# Patient Record
Sex: Female | Born: 1985
Health system: Southern US, Community
[De-identification: ages and names within clinical notes are randomized; demographics above are authoritative.]

## PROBLEM LIST (undated history)

## (undated) DIAGNOSIS — O24419 Gestational diabetes mellitus in pregnancy, unspecified control: Secondary | ICD-10-CM

## (undated) DIAGNOSIS — O139 Gestational [pregnancy-induced] hypertension without significant proteinuria, unspecified trimester: Secondary | ICD-10-CM

## (undated) HISTORY — PX: OTHER SURGICAL HISTORY: SHX169

## (undated) HISTORY — DX: Gestational (pregnancy-induced) hypertension without significant proteinuria, unspecified trimester: O13.9

## (undated) HISTORY — DX: Gestational diabetes mellitus in pregnancy, unspecified control: O24.419

---
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
## 2010-03-27 ENCOUNTER — Ambulatory Visit: Payer: Self-pay | Admitting: Interventional Radiology

## 2010-03-27 ENCOUNTER — Emergency Department (HOSPITAL_BASED_OUTPATIENT_CLINIC_OR_DEPARTMENT_OTHER): Admission: EM | Admit: 2010-03-27 | Discharge: 2010-03-27 | Payer: Self-pay | Admitting: Emergency Medicine

## 2010-10-07 ENCOUNTER — Emergency Department (HOSPITAL_BASED_OUTPATIENT_CLINIC_OR_DEPARTMENT_OTHER)
Admission: EM | Admit: 2010-10-07 | Discharge: 2010-10-07 | Payer: Self-pay | Source: Home / Self Care | Admitting: Emergency Medicine

## 2011-02-18 NOTE — Op Note (Signed)
[place]  Patient:    Sara Keith, Sara Keith Visit Number: [id number] MRN: [id number]          Service Type: DSU Location: [facility] Attending Physician:  Gustavus Messing Dictated by:   Yaakov Guthrie. Shon Hough, M.D. Admit Date:  03/11/2002                             Operative Report  PROCEDURES:  Wide excision of hidradenitis suppurativa of the right axilla with Rhyan-Pollack closure.  ANESTHESIA:  General.  INDICATIONS:  A 25 year old lady with severe history of hidradenitis involving the right axilla.  The patient has had an increase in pain, discomfort, drainage, and soreness of the areas chronically.  He has not responded to conservative treatment.  DESCRIPTION OF PROCEDURE:  The patient was taken to the operating room and placed on the operating room table in the supine position.  Drawings were made to outline all of the hair bearing areas involving the right axilla.  She underwent general anesthesia and was intubated orally.  Prep was done to the right axilla, arm, and chest areas using Betadine soap and solution and walled off with sterile towels and drapes so as to make a sterile field. Xylocaine 0.5% with epinephrine 1:200,000 concentration was injected locally, a total of 100 cc.  The area was scored with a #15 blade and the skin was excised down to underlying superficial fascia, removing all of the disease en bloc.  After proper hemostasis, the medial and lateral edges were then freed up significantly approximately 5 cm.  Closure was done with 0 Monocryl x 2 levels in the subcu plane and the fascia, a subdermal suture of 3-0 Monocryl, and then a running subcuticular stitch of 3-0 Monocryl.  The wounds were drained with a #10 Blake drain which was placed in the depths of the wound and brought out through the lateral most portion of the incision inferiorly and secured with 3-0 Prolene.  The wounds were cleans. Steri-Strips and a soft  dressing were applied to all areas.  She withstood the procedures very well and taken to recovery in excellent condition. Dictated by:   Yaakov Guthrie. Shon Hough, M.D. Attending Physician:  Gustavus Messing DD:  03/11/02 TD:  03/12/02 Job: [id number]

## 2012-10-14 ENCOUNTER — Emergency Department (HOSPITAL_BASED_OUTPATIENT_CLINIC_OR_DEPARTMENT_OTHER)
Admission: EM | Admit: 2012-10-14 | Discharge: 2012-10-14 | Disposition: A | Discharge status: Home / Self Care | Payer: Medicaid Other | Attending: Emergency Medicine | Admitting: Emergency Medicine

## 2012-10-14 ENCOUNTER — Encounter (HOSPITAL_BASED_OUTPATIENT_CLINIC_OR_DEPARTMENT_OTHER): Payer: Self-pay | Admitting: *Deleted

## 2012-10-14 DIAGNOSIS — Z3202 Encounter for pregnancy test, result negative: Secondary | ICD-10-CM | POA: Insufficient documentation

## 2012-10-14 DIAGNOSIS — L02412 Cutaneous abscess of left axilla: Secondary | ICD-10-CM

## 2012-10-14 DIAGNOSIS — IMO0002 Reserved for concepts with insufficient information to code with codable children: Secondary | ICD-10-CM | POA: Insufficient documentation

## 2012-10-14 LAB — PREGNANCY, URINE: Preg Test, Ur: NEGATIVE

## 2012-10-14 MED ORDER — HYDROCODONE-ACETAMINOPHEN 5-325 MG PO TABS: 2.0000 | ORAL_TABLET | ORAL | Status: DC | PRN

## 2012-10-14 MED ORDER — SULFAMETHOXAZOLE-TRIMETHOPRIM 800-160 MG PO TABS: 1.0000 | ORAL_TABLET | Freq: Two times a day (BID) | ORAL | Status: DC

## 2012-10-14 MED ORDER — LIDOCAINE HCL 2 % IJ SOLN
10.0000 mL | Freq: Once | INTRAMUSCULAR | Status: AC
  Administered 2012-10-14: 200 mg
  Filled 2012-10-14: qty 20

## 2012-10-14 NOTE — ED Provider Notes (Signed)
Medical screening examination/treatment/procedure(s) were performed by non-physician practitioner and as supervising physician I was immediately available for consultation/collaboration.   Gavin Pound. Rudransh Bellanca, MD 10/14/12 1850

## 2012-10-14 NOTE — ED Provider Notes (Signed)
History     CSN: [id number]  Arrival date & time 10/14/12  1615   First MD Initiated Contact with Patient 10/14/12 1740      Chief Complaint  Patient presents with  . Recurrent Skin Infections    (Consider location/radiation/quality/duration/timing/severity/associated sxs/prior treatment) Patient is a 27 y.o. female presenting with abscess. The history is provided by the patient. No language interpreter was used.  Abscess  This is a new problem. The problem occurs continuously. The problem has been gradually worsening. The abscess is present on the left arm. The problem is severe. The abscess is characterized by swelling, painfulness and itchiness. There were no sick contacts.  Pt has had multiple skin abscesses and surgery to decrease(hidradenitis suppurantia) Pt complains of a large abscess under right arm History reviewed. No pertinent past medical history.  Past Surgical History  Procedure Date  . Sweat glan     History reviewed. No pertinent family history.  History  Substance Use Topics  . Smoking status: Never Smoker   . Smokeless tobacco: Not on file  . Alcohol Use: No    OB History    Grav Para Term Preterm Abortions TAB SAB Ect Mult Living                  Review of Systems  Skin: Positive for wound.  All other systems reviewed and are negative.    Allergies  Review of patient's allergies indicates no known allergies.  Home Medications   Current Outpatient Rx  Name  Route  Sig  Dispense  Refill  . HYDROCODONE-ACETAMINOPHEN 5-325 MG PO TABS   Oral   Take 2 tablets by mouth every 4 (four) hours as needed for pain.   10 tablet   0   . SULFAMETHOXAZOLE-TRIMETHOPRIM 800-160 MG PO TABS   Oral   Take 1 tablet by mouth every 12 (twelve) hours.   10 tablet   0     BP 151/84  Pulse 82  Temp 98.3 F (36.8 C) (Oral)  Resp 18  SpO2 100%  LMP 08/14/2012  Physical Exam  Nursing note and vitals reviewed. Constitutional: She is oriented to  person, place, and time. She appears well-developed and well-nourished.  Musculoskeletal: She exhibits tenderness.       Large abscess under left axilla  Neurological: She is alert and oriented to person, place, and time. She has normal reflexes.  Skin: There is erythema.  Psychiatric: She has a normal mood and affect.    ED Course  INCISION AND DRAINAGE Date/Time: 10/14/2012 6:48 PM Performed by: Elson Areas Authorized by: Elson Areas Consent: Verbal consent not obtained. Risks and benefits: risks, benefits and alternatives were discussed Consent given by: patient Patient understanding: patient does not state understanding of the procedure being performed Required items: required blood products, implants, devices, and special equipment available Patient identity confirmed: verbally with patient Type: abscess Body area: upper extremity Anesthesia: local infiltration Patient sedated: no Scalpel size: 11 Incision type: single straight Complexity: simple Drainage: purulent Wound treatment: wound left open Packing material: 1/4 in iodoform gauze Patient tolerance: Patient tolerated the procedure well with no immediate complications.   (including critical care time)   Labs Reviewed  PREGNANCY, URINE   No results found.   1. Abscess of left axilla       MDM          Elson Areas, [place] 10/14/12 1849

## 2012-10-14 NOTE — ED Notes (Signed)
Patient has a large abcess under left arm. States that she has had sweat glands removed due to the same.

## 2013-02-05 ENCOUNTER — Encounter (HOSPITAL_BASED_OUTPATIENT_CLINIC_OR_DEPARTMENT_OTHER): Payer: Self-pay | Admitting: *Deleted

## 2013-02-05 ENCOUNTER — Emergency Department (HOSPITAL_BASED_OUTPATIENT_CLINIC_OR_DEPARTMENT_OTHER)
Admission: EM | Admit: 2013-02-05 | Discharge: 2013-02-05 | Disposition: A | Discharge status: Home / Self Care | Payer: Medicaid Other | Attending: Emergency Medicine | Admitting: Emergency Medicine

## 2013-02-05 DIAGNOSIS — R1033 Periumbilical pain: Secondary | ICD-10-CM | POA: Insufficient documentation

## 2013-02-05 DIAGNOSIS — K5289 Other specified noninfective gastroenteritis and colitis: Secondary | ICD-10-CM | POA: Insufficient documentation

## 2013-02-05 DIAGNOSIS — R112 Nausea with vomiting, unspecified: Secondary | ICD-10-CM | POA: Insufficient documentation

## 2013-02-05 DIAGNOSIS — Z3202 Encounter for pregnancy test, result negative: Secondary | ICD-10-CM | POA: Insufficient documentation

## 2013-02-05 DIAGNOSIS — R109 Unspecified abdominal pain: Secondary | ICD-10-CM

## 2013-02-05 DIAGNOSIS — R197 Diarrhea, unspecified: Secondary | ICD-10-CM | POA: Insufficient documentation

## 2013-02-05 DIAGNOSIS — R6883 Chills (without fever): Secondary | ICD-10-CM | POA: Insufficient documentation

## 2013-02-05 DIAGNOSIS — K529 Noninfective gastroenteritis and colitis, unspecified: Secondary | ICD-10-CM

## 2013-02-05 LAB — CBC WITH DIFFERENTIAL/PLATELET
Eosinophils Relative: 1 % (ref 0–5)
HCT: 39.2 % (ref 36.0–46.0)
Hemoglobin: 13 g/dL (ref 12.0–15.0)
MCHC: 33.2 g/dL (ref 30.0–36.0)
MCV: 70.4 fL — ABNORMAL LOW (ref 78.0–100.0)
Neutro Abs: 7 10*3/uL (ref 1.7–7.7)
Platelets: ADEQUATE 10*3/uL (ref 150–400)
RBC: 5.57 MIL/uL — ABNORMAL HIGH (ref 3.87–5.11)
RDW: 14.2 % (ref 11.5–15.5)
WBC: 9.7 10*3/uL (ref 4.0–10.5)

## 2013-02-05 LAB — URINALYSIS, ROUTINE W REFLEX MICROSCOPIC
Bilirubin Urine: NEGATIVE
Glucose, UA: NEGATIVE mg/dL
Leukocytes, UA: NEGATIVE
Protein, ur: NEGATIVE mg/dL
Specific Gravity, Urine: 1.022 (ref 1.005–1.030)
pH: 8.5 — ABNORMAL HIGH (ref 5.0–8.0)

## 2013-02-05 LAB — COMPREHENSIVE METABOLIC PANEL
ALT: 16 U/L (ref 0–35)
Alkaline Phosphatase: 58 U/L (ref 39–117)
Calcium: 9.7 mg/dL (ref 8.4–10.5)
Chloride: 105 mEq/L (ref 96–112)
Creatinine, Ser: 0.8 mg/dL (ref 0.50–1.10)
GFR calc Af Amer: >90 mL/min (ref >90)
Sodium: 139 mEq/L (ref 135–145)
Total Bilirubin: 0.4 mg/dL (ref 0.3–1.2)

## 2013-02-05 LAB — PREGNANCY, URINE: Preg Test, Ur: NEGATIVE

## 2013-02-05 MED ORDER — ONDANSETRON HCL 4 MG/2ML IJ SOLN
4.0000 mg | Freq: Once | INTRAMUSCULAR | Status: AC
  Administered 2013-02-05: 4 mg via INTRAVENOUS
  Filled 2013-02-05: qty 2

## 2013-02-05 MED ORDER — METOCLOPRAMIDE HCL 10 MG PO TABS: 10.0000 mg | ORAL_TABLET | Freq: Four times a day (QID) | ORAL | Status: DC | PRN

## 2013-02-05 MED ORDER — SODIUM CHLORIDE 0.9 % IV SOLN
1000.0000 mL | Freq: Once | INTRAVENOUS | Status: AC
  Administered 2013-02-05: 1000 mL via INTRAVENOUS

## 2013-02-05 MED ORDER — MORPHINE SULFATE 4 MG/ML IJ SOLN
4.0000 mg | Freq: Once | INTRAMUSCULAR | Status: AC
  Administered 2013-02-05: 4 mg via INTRAVENOUS
  Filled 2013-02-05: qty 1

## 2013-02-05 MED ORDER — OXYCODONE-ACETAMINOPHEN 5-325 MG PO TABS: 1.0000 | ORAL_TABLET | ORAL | Status: DC | PRN

## 2013-02-05 MED ORDER — SODIUM CHLORIDE 0.9 % IV SOLN: 1000.0000 mL | INTRAVENOUS | Status: DC

## 2013-02-05 NOTE — ED Notes (Signed)
Abdominal pain with vomiting and diarrhea onset last night at 9 pm pt states also had chills last night  Ambulatory to exam room Pt denies pain with urination also denies any vaginal discharge

## 2013-02-05 NOTE — ED Provider Notes (Signed)
History     CSN: [id number]  Arrival date & time 02/05/13  7829   First MD Initiated Contact with Patient 02/05/13 1036      Chief Complaint  Patient presents with  . Abdominal Pain    (Consider location/radiation/quality/duration/timing/severity/associated sxs/prior treatment) Patient is a 27 y.o. female presenting with abdominal pain. The history is provided by the patient.  Abdominal Pain She had onset last night of crampy periumbilical pain with associated nausea and vomiting. She had one episode of diarrhea. Pain is severe and she rates it at 10/10. Pain improves modestly after vomiting. She had one episode of diarrhea. She has had chills and sweats but denies any fever. She denies any sick contacts. She has not taken anything to try and help her symptoms.  History reviewed. No pertinent past medical history.  Past Surgical History  Procedure Laterality Date  . Sweat glan      History reviewed. No pertinent family history.  History  Substance Use Topics  . Smoking status: Never Smoker   . Smokeless tobacco: Not on file  . Alcohol Use: No    OB History   Grav Para Term Preterm Abortions TAB SAB Ect Mult Living                  Review of Systems  Gastrointestinal: Positive for abdominal pain.  All other systems reviewed and are negative.    Allergies  Review of patient's allergies indicates no known allergies.  Home Medications   Current Outpatient Rx  Name  Route  Sig  Dispense  Refill  . HYDROcodone-acetaminophen (NORCO/VICODIN) 5-325 MG per tablet   Oral   Take 2 tablets by mouth every 4 (four) hours as needed for pain.   10 tablet   0   . sulfamethoxazole-trimethoprim (SEPTRA DS) 800-160 MG per tablet   Oral   Take 1 tablet by mouth every 12 (twelve) hours.   10 tablet   0     BP 151/66  Pulse 84  Temp(Src) 98.5 F (36.9 C) (Oral)  Resp 22  Ht 5\' 7"  (1.702 m)  Wt 265 lb (120.203 kg)  BMI 41.5 kg/m2  SpO2 99%  LMP  01/14/2013  Physical Exam  Nursing note and vitals reviewed.  27 year old female, resting comfortably and in no acute distress. Vital signs are significant for hypertension with blood pressure 151/66, and tachypnea with respiratory rate of 22. Oxygen saturation is 99%, which is normal. Head is normocephalic and atraumatic. PERRLA, EOMI. Oropharynx is clear. Neck is nontender and supple without adenopathy or JVD. Back is nontender and there is no CVA tenderness. Lungs are clear without rales, wheezes, or rhonchi. Chest is nontender. Heart has regular rate and rhythm without murmur. Abdomen is soft, flat, with mild tenderness diffusely. There is no rebound or guarding. There are no masses or hepatosplenomegaly and peristalsis is hyp oactive. Extremities have no cyanosis or edema, full range of motion is present. Skin is warm and dry without rash. Neurologic: Mental status is normal, cranial nerves are intact, there are no motor or sensory deficits.  ED Course  Procedures (including critical care time)  Results for orders placed during the hospital encounter of 02/05/13  PREGNANCY, URINE      Result Value Range   Preg Test, Ur NEGATIVE  NEGATIVE  URINALYSIS, ROUTINE W REFLEX MICROSCOPIC      Result Value Range   Color, Urine YELLOW  YELLOW   APPearance CLEAR  CLEAR   Specific Gravity, Urine  1.022  1.005 - 1.030   pH 8.5 (*) 5.0 - 8.0   Glucose, UA NEGATIVE  NEGATIVE mg/dL   Hgb urine dipstick NEGATIVE  NEGATIVE   Bilirubin Urine NEGATIVE  NEGATIVE   Ketones, ur NEGATIVE  NEGATIVE mg/dL   Protein, ur NEGATIVE  NEGATIVE mg/dL   Urobilinogen, UA 0.2  0.0 - 1.0 mg/dL   Nitrite NEGATIVE  NEGATIVE   Leukocytes, UA NEGATIVE  NEGATIVE  CBC WITH DIFFERENTIAL      Result Value Range   WBC 9.7  4.0 - 10.5 K/uL   RBC 5.57 (*) 3.87 - 5.11 MIL/uL   Hemoglobin 13.0  12.0 - 15.0 g/dL   HCT 16.1  09.6 - 04.5 %   MCV 70.4 (*) 78.0 - 100.0 fL   MCH 23.3 (*) 26.0 - 34.0 pg   MCHC 33.2  30.0 -  36.0 g/dL   RDW 40.9  81.1 - 91.4 %   Platelets    150 - 400 K/uL   Value: PLATELET CLUMPS NOTED ON SMEAR, COUNT APPEARS ADEQUATE   Neutrophils Relative 72  43 - 77 %   Lymphocytes Relative 20  12 - 46 %   Monocytes Relative 7  3 - 12 %   Eosinophils Relative 1  0 - 5 %   Basophils Relative 0  0 - 1 %   Neutro Abs 7.0  1.7 - 7.7 K/uL   Lymphs Abs 1.9  0.7 - 4.0 K/uL   Monocytes Absolute 0.7  0.1 - 1.0 K/uL   Eosinophils Absolute 0.1  0.0 - 0.7 K/uL   Basophils Absolute 0.0  0.0 - 0.1 K/uL   Smear Review LARGE PLATELETS PRESENT    COMPREHENSIVE METABOLIC PANEL      Result Value Range   Sodium 139  135 - 145 mEq/L   Potassium 4.1  3.5 - 5.1 mEq/L   Chloride 105  96 - 112 mEq/L   CO2 24  19 - 32 mEq/L   Glucose, Bld 114 (*) 70 - 99 mg/dL   BUN 6  6 - 23 mg/dL   Creatinine, Ser 7.82  0.50 - 1.10 mg/dL   Calcium 9.7  8.4 - 95.6 mg/dL   Total Protein 7.6  6.0 - 8.3 g/dL   Albumin 3.9  3.5 - 5.2 g/dL   AST 16  0 - 37 U/L   ALT 16  0 - 35 U/L   Alkaline Phosphatase 58  39 - 117 U/L   Total Bilirubin 0.4  0.3 - 1.2 mg/dL   GFR calc non Af Amer >90  >90 mL/min   GFR calc Af Amer >90  >90 mL/min  LIPASE, BLOOD      Result Value Range   Lipase 21  11 - 59 U/L     1. Gastroenteritis   2. Abdominal pain       MDM  Crampy abdominal pain, vomiting, diarrhea most consistent with viral gastroenteritis. Screening labs have been ordered and she'll be given IV fluids, IV ondansetron, and IV morphine.  Nausea is improved with above noted treatment but she continues to complain of abdominal pain. Laboratory workup is unremarkable including normal WBC normal differential. She'll be given additional dose of morphine. If she is not improving, may need to consider CT scan.  She got good relief with second dose of morphine. She is feeling much better and has taken oral fluids and crackers. She is discharged with prescriptions for metoclopramide for nausea and advised to use over-the-counter  loperamide  as needed for diarrhea. She's also given a prescription for a small number of Percocet if needed for abdominal pain. She is to return if symptoms worsen.  Dione Booze, MD 02/05/13 [phone]

## 2013-02-05 NOTE — ED Notes (Signed)
Pt states she is unable to void at present 

## 2013-09-24 LAB — OB RESULTS CONSOLE ANTIBODY SCREEN: Antibody Screen: NEGATIVE

## 2013-09-24 LAB — OB RESULTS CONSOLE HIV ANTIBODY (ROUTINE TESTING): HIV: NONREACTIVE

## 2013-09-24 LAB — OB RESULTS CONSOLE ABO/RH: RH TYPE: POSITIVE

## 2013-09-24 LAB — OB RESULTS CONSOLE GC/CHLAMYDIA
Chlamydia: NEGATIVE
Gonorrhea: NEGATIVE

## 2013-09-24 LAB — OB RESULTS CONSOLE RPR: RPR: NONREACTIVE

## 2013-09-24 LAB — OB RESULTS CONSOLE HEPATITIS B SURFACE ANTIGEN: HEP B S AG: NEGATIVE

## 2013-09-24 LAB — OB RESULTS CONSOLE RUBELLA ANTIBODY, IGM: RUBELLA: UNDETERMINED

## 2013-10-22 ENCOUNTER — Other Ambulatory Visit: Payer: Self-pay

## 2013-11-06 ENCOUNTER — Other Ambulatory Visit (HOSPITAL_COMMUNITY): Payer: Self-pay | Admitting: Obstetrics and Gynecology

## 2013-11-06 ENCOUNTER — Encounter (HOSPITAL_COMMUNITY): Payer: Self-pay | Admitting: Obstetrics and Gynecology

## 2013-11-06 DIAGNOSIS — O280 Abnormal hematological finding on antenatal screening of mother: Secondary | ICD-10-CM

## 2013-11-06 DIAGNOSIS — Z3689 Encounter for other specified antenatal screening: Secondary | ICD-10-CM

## 2013-11-15 ENCOUNTER — Ambulatory Visit (HOSPITAL_COMMUNITY)
Admission: RE | Admit: 2013-11-15 | Discharge: 2013-11-15 | Disposition: A | Discharge status: Home / Self Care | Payer: Medicaid Other | Source: Ambulatory Visit | Attending: Obstetrics and Gynecology | Admitting: Obstetrics and Gynecology

## 2013-11-15 ENCOUNTER — Encounter (HOSPITAL_COMMUNITY): Payer: Self-pay

## 2013-11-15 ENCOUNTER — Ambulatory Visit (HOSPITAL_COMMUNITY): Admission: RE | Admit: 2013-11-15 | Payer: Medicaid Other | Source: Ambulatory Visit

## 2013-11-15 ENCOUNTER — Other Ambulatory Visit (HOSPITAL_COMMUNITY): Payer: Self-pay | Admitting: Obstetrics and Gynecology

## 2013-11-15 DIAGNOSIS — Z3689 Encounter for other specified antenatal screening: Secondary | ICD-10-CM

## 2013-11-15 DIAGNOSIS — O280 Abnormal hematological finding on antenatal screening of mother: Secondary | ICD-10-CM

## 2013-11-15 DIAGNOSIS — Z1389 Encounter for screening for other disorder: Secondary | ICD-10-CM | POA: Insufficient documentation

## 2013-11-15 DIAGNOSIS — O289 Unspecified abnormal findings on antenatal screening of mother: Secondary | ICD-10-CM | POA: Insufficient documentation

## 2013-11-15 DIAGNOSIS — O358XX0 Maternal care for other (suspected) fetal abnormality and damage, not applicable or unspecified: Secondary | ICD-10-CM | POA: Insufficient documentation

## 2013-11-15 DIAGNOSIS — Z363 Encounter for antenatal screening for malformations: Secondary | ICD-10-CM | POA: Insufficient documentation

## 2013-11-21 ENCOUNTER — Telehealth (HOSPITAL_COMMUNITY): Payer: Self-pay | Admitting: *Deleted

## 2013-11-21 NOTE — Telephone Encounter (Signed)
Pt notified of fetal echo appt tomorrow at 2pm at Dr. Beryl MeagerMauer's office.  Pt verbalized understanding. Also reviewed that Quad screen came back normal after recalculation.

## 2013-12-10 ENCOUNTER — Other Ambulatory Visit (HOSPITAL_COMMUNITY): Payer: Self-pay | Admitting: Obstetrics and Gynecology

## 2013-12-10 DIAGNOSIS — O35BXX0 Maternal care for other (suspected) fetal abnormality and damage, fetal cardiac anomalies, not applicable or unspecified: Secondary | ICD-10-CM

## 2013-12-10 DIAGNOSIS — O358XX0 Maternal care for other (suspected) fetal abnormality and damage, not applicable or unspecified: Secondary | ICD-10-CM

## 2013-12-13 ENCOUNTER — Ambulatory Visit (HOSPITAL_COMMUNITY)
Admission: RE | Admit: 2013-12-13 | Discharge: 2013-12-13 | Disposition: A | Discharge status: Home / Self Care | Payer: Medicaid Other | Source: Ambulatory Visit | Attending: Obstetrics and Gynecology | Admitting: Obstetrics and Gynecology

## 2013-12-13 DIAGNOSIS — O34219 Maternal care for unspecified type scar from previous cesarean delivery: Secondary | ICD-10-CM | POA: Insufficient documentation

## 2013-12-13 DIAGNOSIS — O289 Unspecified abnormal findings on antenatal screening of mother: Secondary | ICD-10-CM | POA: Insufficient documentation

## 2013-12-13 DIAGNOSIS — O99210 Obesity complicating pregnancy, unspecified trimester: Secondary | ICD-10-CM

## 2013-12-13 DIAGNOSIS — E669 Obesity, unspecified: Secondary | ICD-10-CM | POA: Insufficient documentation

## 2013-12-13 DIAGNOSIS — O35BXX0 Maternal care for other (suspected) fetal abnormality and damage, fetal cardiac anomalies, not applicable or unspecified: Secondary | ICD-10-CM

## 2013-12-13 DIAGNOSIS — O358XX0 Maternal care for other (suspected) fetal abnormality and damage, not applicable or unspecified: Secondary | ICD-10-CM

## 2013-12-13 DIAGNOSIS — IMO0001 Reserved for inherently not codable concepts without codable children: Secondary | ICD-10-CM | POA: Insufficient documentation

## 2014-01-07 ENCOUNTER — Other Ambulatory Visit (HOSPITAL_COMMUNITY): Payer: Self-pay | Admitting: Obstetrics and Gynecology

## 2014-01-07 DIAGNOSIS — O289 Unspecified abnormal findings on antenatal screening of mother: Secondary | ICD-10-CM

## 2014-01-07 DIAGNOSIS — IMO0001 Reserved for inherently not codable concepts without codable children: Secondary | ICD-10-CM

## 2014-01-07 DIAGNOSIS — O99210 Obesity complicating pregnancy, unspecified trimester: Secondary | ICD-10-CM

## 2014-01-07 DIAGNOSIS — O34219 Maternal care for unspecified type scar from previous cesarean delivery: Secondary | ICD-10-CM

## 2014-01-07 DIAGNOSIS — E669 Obesity, unspecified: Secondary | ICD-10-CM

## 2014-01-10 ENCOUNTER — Ambulatory Visit (HOSPITAL_COMMUNITY)
Admission: RE | Admit: 2014-01-10 | Discharge: 2014-01-10 | Disposition: A | Discharge status: Home / Self Care | Payer: Medicaid Other | Source: Ambulatory Visit | Attending: Obstetrics and Gynecology | Admitting: Obstetrics and Gynecology

## 2014-01-10 ENCOUNTER — Encounter (HOSPITAL_COMMUNITY): Payer: Self-pay

## 2014-01-10 ENCOUNTER — Other Ambulatory Visit (HOSPITAL_COMMUNITY): Payer: Self-pay | Admitting: Obstetrics and Gynecology

## 2014-01-10 DIAGNOSIS — E669 Obesity, unspecified: Secondary | ICD-10-CM | POA: Insufficient documentation

## 2014-01-10 DIAGNOSIS — O289 Unspecified abnormal findings on antenatal screening of mother: Secondary | ICD-10-CM

## 2014-01-10 DIAGNOSIS — O34219 Maternal care for unspecified type scar from previous cesarean delivery: Secondary | ICD-10-CM

## 2014-01-10 DIAGNOSIS — O99210 Obesity complicating pregnancy, unspecified trimester: Secondary | ICD-10-CM

## 2014-01-10 DIAGNOSIS — IMO0001 Reserved for inherently not codable concepts without codable children: Secondary | ICD-10-CM

## 2014-01-31 ENCOUNTER — Other Ambulatory Visit (HOSPITAL_COMMUNITY): Payer: Self-pay | Admitting: Obstetrics and Gynecology

## 2014-01-31 ENCOUNTER — Inpatient Hospital Stay (HOSPITAL_COMMUNITY)
Admission: AD | Admit: 2014-01-31 | Discharge: 2014-01-31 | Disposition: A | Discharge status: Home / Self Care | Payer: Medicaid Other | Source: Ambulatory Visit | Attending: Obstetrics and Gynecology | Admitting: Obstetrics and Gynecology

## 2014-01-31 ENCOUNTER — Ambulatory Visit (HOSPITAL_COMMUNITY)
Admission: RE | Admit: 2014-01-31 | Discharge: 2014-01-31 | Disposition: A | Discharge status: Home / Self Care | Payer: Medicaid Other | Source: Ambulatory Visit | Attending: Obstetrics and Gynecology | Admitting: Obstetrics and Gynecology

## 2014-01-31 ENCOUNTER — Encounter (HOSPITAL_COMMUNITY): Payer: Self-pay | Admitting: General Practice

## 2014-01-31 DIAGNOSIS — O289 Unspecified abnormal findings on antenatal screening of mother: Secondary | ICD-10-CM

## 2014-01-31 DIAGNOSIS — E669 Obesity, unspecified: Secondary | ICD-10-CM

## 2014-01-31 DIAGNOSIS — O352XX0 Maternal care for (suspected) hereditary disease in fetus, not applicable or unspecified: Secondary | ICD-10-CM | POA: Insufficient documentation

## 2014-01-31 DIAGNOSIS — IMO0001 Reserved for inherently not codable concepts without codable children: Secondary | ICD-10-CM

## 2014-01-31 DIAGNOSIS — O34219 Maternal care for unspecified type scar from previous cesarean delivery: Secondary | ICD-10-CM

## 2014-01-31 DIAGNOSIS — R030 Elevated blood-pressure reading, without diagnosis of hypertension: Secondary | ICD-10-CM | POA: Insufficient documentation

## 2014-01-31 DIAGNOSIS — O99210 Obesity complicating pregnancy, unspecified trimester: Secondary | ICD-10-CM

## 2014-01-31 DIAGNOSIS — O99891 Other specified diseases and conditions complicating pregnancy: Secondary | ICD-10-CM | POA: Insufficient documentation

## 2014-01-31 DIAGNOSIS — O0993 Supervision of high risk pregnancy, unspecified, third trimester: Secondary | ICD-10-CM

## 2014-01-31 DIAGNOSIS — O9989 Other specified diseases and conditions complicating pregnancy, childbirth and the puerperium: Principal | ICD-10-CM

## 2014-01-31 DIAGNOSIS — Q270 Congenital absence and hypoplasia of umbilical artery: Secondary | ICD-10-CM | POA: Insufficient documentation

## 2014-01-31 LAB — COMPREHENSIVE METABOLIC PANEL
ALT: 11 U/L (ref 0–35)
AST: 10 U/L (ref 0–37)
Albumin: 2.7 g/dL — ABNORMAL LOW (ref 3.5–5.2)
Alkaline Phosphatase: 67 U/L (ref 39–117)
BUN: 9 mg/dL (ref 6–23)
CO2: 20 mEq/L (ref 19–32)
Calcium: 9.5 mg/dL (ref 8.4–10.5)
Chloride: 100 mEq/L (ref 96–112)
Creatinine, Ser: 0.65 mg/dL (ref 0.50–1.10)
GFR calc Af Amer: >90 mL/min (ref >90)
GFR calc non Af Amer: >90 mL/min (ref >90)
Glucose, Bld: 118 mg/dL — ABNORMAL HIGH (ref 70–99)
POTASSIUM: 4.1 meq/L (ref 3.7–5.3)
Sodium: 134 mEq/L — ABNORMAL LOW (ref 137–147)
TOTAL PROTEIN: 6.8 g/dL (ref 6.0–8.3)
Total Bilirubin: <0.2 mg/dL — ABNORMAL LOW (ref 0.3–1.2)

## 2014-01-31 LAB — CBC
HCT: 34.6 % — ABNORMAL LOW (ref 36.0–46.0)
Hemoglobin: 11.2 g/dL — ABNORMAL LOW (ref 12.0–15.0)
MCH: 24 pg — ABNORMAL LOW (ref 26.0–34.0)
MCHC: 32.4 g/dL (ref 30.0–36.0)
MCV: 74.2 fL — AB (ref 78.0–100.0)
Platelets: 225 10*3/uL (ref 150–400)
RBC: 4.66 MIL/uL (ref 3.87–5.11)
RDW: 14.1 % (ref 11.5–15.5)
WBC: 15.2 10*3/uL — AB (ref 4.0–10.5)

## 2014-01-31 LAB — PROTEIN / CREATININE RATIO, URINE
Creatinine, Urine: 185 mg/dL
PROTEIN CREATININE RATIO: 0.07 (ref 0.00–0.15)
Total Protein, Urine: 12.3 mg/dL

## 2014-01-31 NOTE — MAU Provider Note (Signed)
Chief Complaint:  Hypertension  @MAUPATCONTACT @  HPI: Sara Keith is a 28 y.o. G2P1001 at [redacted]w[redacted]d who presents to maternity admissions directly from MFM for evaluation of potential PIH and labs to eval for pre-eclampsia.  Patient reports that she is here for further lab testing. Denies any active HA, RUQ abd pain, edema, vision changes. Admits to prior migraines during pregnancy resolved with Tylenol.  Of note: Patient was seen by MFM today for monitoring of fetal growth in setting of single umbilical artery. [place] eval with EFW 26%tile, AC 6%tile, posterior placenta, nml AFI, nml doppler studies. Recommendations with appropriate fetal growth.  Denies contractions, leakage of fluid or vaginal bleeding. Good fetal movement.   Of note: No prior hx of HTN before or during prior pregnancy.  Pregnancy Course:  Sara Keith @ [place]. Pregnancy complicated by single umbilical artery, followed by MFM for growth monitoring.  Past Medical History: History reviewed. No pertinent past medical history.  Past obstetric history: OB History  Gravida Para Term Preterm AB SAB TAB Ectopic Multiple Living  2 1 1       1     # Outcome Date GA Lbr Len/2nd Weight Sex Delivery Anes PTL Lv  2 CUR           1 TRM               Past Surgical History: Past Surgical History  Procedure Laterality Date  . Sweat glan      Family History: History reviewed. No pertinent family history.  Social History: History  Substance Use Topics  . Smoking status: Never Smoker   . Smokeless tobacco: Not on file  . Alcohol Use: No    Allergies: No Known Allergies  Meds:  Prescriptions prior to admission  Medication Sig Dispense Refill  . acetaminophen (TYLENOL) 500 MG tablet Take 1,500-2,000 mg by mouth every 6 (six) hours as needed for headache.      . Prenatal Vit-Fe Fumarate-FA (MULTIVITAMIN-PRENATAL) 27-0.8 MG TABS tablet Take 1 tablet by mouth daily at 12 noon.      . Prenatal Vit-Min-FA-Fish Oil ([facility]  PRENATAL GUMMY PO) Take 1 tablet by mouth 3 (three) times daily.        ROS: Pertinent findings in history of present illness.  Physical Exam  Blood pressure 151/71, pulse 89, temperature 98.4 F (36.9 C), resp. rate 18, height 5\' 6"  (1.676 m), last menstrual period 07/02/2013. GENERAL: obese, well-appearing, comfortable, NAD HEENT: PERRL, EOMI, MMM HEART: RRR, no murmurs RESP: CTAB, normal effort ABDOMEN: Soft, non-tender, gravid appropriate for gestational age EXTREMITIES: Nontender, no edema NEURO: alert and oriented    FHT:  Baseline 145 bpm, moderate variability, accelerations present, no decelerations Contractions: None   Labs: Results for orders placed during the Keith encounter of 01/31/14 (from the past 24 hour(s))  PROTEIN / CREATININE RATIO, URINE     Status: None   Collection Time    01/31/14  4:50 PM      Result Value Ref Range   Creatinine, Urine 185.00     Total Protein, Urine 12.3     PROTEIN CREATININE RATIO 0.07  0.00 - 0.15  COMPREHENSIVE METABOLIC PANEL     Status: Abnormal   Collection Time    01/31/14  5:04 PM      Result Value Ref Range   Sodium 134 (*) 137 - 147 mEq/L   Potassium 4.1  3.7 - 5.3 mEq/L   Chloride 100  96 - 112 mEq/L   CO2 20  19 - 32 mEq/L   Glucose, Bld 118 (*) 70 - 99 mg/dL   BUN 9  6 - 23 mg/dL   Creatinine, Ser 1.610.65  0.50 - 1.10 mg/dL   Calcium 9.5  8.4 - 09.610.5 mg/dL   Total Protein 6.8  6.0 - 8.3 g/dL   Albumin 2.7 (*) 3.5 - 5.2 g/dL   AST 10  0 - 37 U/L   ALT 11  0 - 35 U/L   Alkaline Phosphatase 67  39 - 117 U/L   Total Bilirubin <0.2 (*) 0.3 - 1.2 mg/dL   GFR calc non Af Amer >90  >90 mL/min   GFR calc Af Amer >90  >90 mL/min  CBC     Status: Abnormal   Collection Time    01/31/14  5:04 PM      Result Value Ref Range   WBC 15.2 (*) 4.0 - 10.5 K/uL   RBC 4.66  3.87 - 5.11 MIL/uL   Hemoglobin 11.2 (*) 12.0 - 15.0 g/dL   HCT 04.534.6 (*) 40.936.0 - 81.146.0 %   MCV 74.2 (*) 78.0 - 100.0 fL   MCH 24.0 (*) 26.0 - 34.0 pg    MCHC 32.4  30.0 - 36.0 g/dL   RDW 91.414.1  78.211.5 - 95.615.5 %   Platelets 225  150 - 400 K/uL    MAU Course:   Assessment: 1. Elevated blood pressure   2. Supervision of high risk pregnancy in third trimester    Clovis FredricksonShaterika D Fredric MareBailey is a 28 y.o. G2P1001 at 3471w3d, who presented to MAU directly from MFM clinic (fetal growth monitoring hx single umbilical artery), sent for evaluation for Pre-eclampsia given recent elevated mild range BP (SBP >140), concerning for new dx GHTN vs Pre-eclampsia.  UPDATE 1815 - Initial vitals stable with multiple elevated SBP >140 to 150, since improved. Ruled out Pre-eclampsia eval: CBC (nml plt 225), CMET (nml Cr 0.65, nml LFTs), UPC (0.07 nml). Asymptomatic without HA, RUQ abd pain, edema, or vision changes.  Plan: - Consult Dr Jolayne Pantheronstant - Per MFM recommendations, ruled out Pre-eclampsia with lab work-up - Advise patient to complete 24 hr urine collection appropriately and [facility] [place] [facility] - Pt to f/u on MFM recommendations for treatment of GHTN [place] [facility] [place] - Recommend continued close monitoring for surveillance of s/s of Severe GHTN vs progression to Pre-eclampsia - Discharge to home with pre-eclampsia s/s precautions      Follow-up Information   Follow up with ARUS,ARIEL, MD On 02/03/2014. (For follow-up Prenatal Care Visit)    Specialty:  Obstetrics and Gynecology   Contact information:   [address] [place] [id number] [phone]       Follow up with [facility] On 02/07/2014. (at 12:15pm for monitoring)    Specialty:  Maternal and Fetal Medicine   Contact information:   [address] [id number] [place] [address] [phone]       Medication List         acetaminophen 500 MG tablet  Commonly known as:  TYLENOL  Take 1,500-2,000 mg by mouth every 6 (six) hours as needed for headache.     [facility]S PRENATAL GUMMY PO  Take 1 tablet by mouth 3 (three)  times daily.     multivitamin-prenatal 27-0.8 MG Tabs tablet  Take 1 tablet by mouth daily at 12 noon.        Lyn HollingsheadAlexander  Althea CharonKaramalegos, DO [facility], PGY-1 01/31/2014 6:48 PM  I have seen this patient and agree with the above resident's note.  LEFTWICH-KIRBY, Alexes Lamarque Certified Nurse-Midwife

## 2014-01-31 NOTE — MAU Provider Note (Signed)
Attestation of Attending Supervision of Advanced Practitioner (CNM/NP): Evaluation and management procedures were performed by the Advanced Practitioner under my supervision and collaboration.  I have reviewed the Advanced Practitioner's note and chart, and I agree with the management and plan.  Winnie Umali 01/31/2014 9:25 PM

## 2014-01-31 NOTE — Discharge Instructions (Signed)
Your blood work and urine tests were all normal. You do not have Pre-eclampsia at this time. Your blood pressure was elevated, and we recommend that your regular OB doctor continues to monitor this at your Prenatal Visits Please discuss with your OB doctor about the plan for collecting your 24 hour urine sample, they will instruct you further Continue to follow-up with Maternal Fetal Medicine here for your monitoring, as this is very important  If you develop severe persistent Headache (that does not improve with Tylenol or rest), and or vision changes (blurry vision with spots), Severe Right Upper Abdominal pain, please call your OB doctor to be seen immediately, otherwise return for further evaluation.   Hypertension During Pregnancy Hypertension is also called high blood pressure. It can occur at any time in life and during pregnancy. When you have hypertension, there is extra pressure inside your blood vessels that carry blood from the heart to the rest of your body (arteries). Hypertension during pregnancy can cause problems for you and your baby. Your baby might not weigh as much as it should at birth or might be born early (premature). Very bad cases of hypertension during pregnancy can be life threatening.  Different types of hypertension can occur during pregnancy.   Chronic hypertension. This happens when a woman has hypertension before pregnancy and it continues during pregnancy.  Gestational hypertension. This is when hypertension develops during pregnancy.  Preeclampsia or toxemia of pregnancy. This is a very serious type of hypertension that develops only during pregnancy. It is a disease that affects the whole body (systemic) and can be very dangerous for both mother and baby.  Gestational hypertension and preeclampsia usually go away after your baby is born. Blood pressure generally stabilizes within 6 weeks. Women who have hypertension during pregnancy have a greater chance of  developing hypertension later in life or with future pregnancies. RISK FACTORS Some factors make you more likely to develop hypertension during pregnancy. Risk factors include:  Having hypertension before pregnancy.  Having hypertension during a previous pregnancy.  Being overweight.  Being older than 40.  Being pregnant with more than one baby (multiples).  Having diabetes or kidney problems. SIGNS AND SYMPTOMS Chronic and gestational hypertension rarely cause symptoms. Preeclampsia has symptoms, which may include:  Increased protein in your urine. Your health care provider will check for this at every prenatal visit.  Swelling of your hands and face.  Rapid weight gain.  Headaches.  Visual changes.  Being bothered by light.  Abdominal pain, especially in the right upper area.  Chest pain.  Shortness of breath.  Increased reflexes.  Seizures. Seizures occur with a more severe form of preeclampsia, called eclampsia. DIAGNOSIS   You may be diagnosed with hypertension during a regular prenatal exam. At each visit, tests may include:  Blood pressure checks.  A urine test to check for protein in your urine.  The type of hypertension you are diagnosed with depends on when you developed it. It also depends on your specific blood pressure reading.  Developing hypertension before 20 weeks of pregnancy is consistent with chronic hypertension.  Developing hypertension after 20 weeks of pregnancy is consistent with gestational hypertension.  Hypertension with increased urinary protein is diagnosed as preeclampsia.  Blood pressure measurements that stay above 160 systolic or 110 diastolic are a sign of severe preeclampsia. TREATMENT Treatment for hypertension during pregnancy varies. Treatment depends on the type of hypertension and how serious it is.  If you take medicine for chronic hypertension,  you may need to switch medicines.  Drugs called ACE inhibitors should  not be taken during pregnancy.  Low-dose aspirin may be suggested for women who have risk factors for preeclampsia.  If you have gestational hypertension, you may need to take a blood pressure medicine that is safe during pregnancy. Your health care provider will recommend the appropriate medicine.  If you have severe preeclampsia, you may need to be in the hospital. Health care providers will watch you and the baby very closely. You also may need to take medicine (magnesium sulfate) to prevent seizures and lower blood pressure.  Sometimes an early delivery is needed. This may be the case if the condition worsens. It would be done to protect you and the baby. The only cure for preeclampsia is delivery. HOME CARE INSTRUCTIONS  Schedule and keep all of your regular appointments for prenatal care.  Only take over-the-counter or prescription medicines as directed by your health care provider. Tell your health care provider about all medicines you take.  Eat as little salt as possible.  Get regular exercise.  Do not drink alcohol.  Do not use tobacco products.  Do not drink products with caffeine.  Lie on your left side when resting. SEEK IMMEDIATE MEDICAL CARE IF:  You have severe abdominal pain.  You have sudden swelling in the hands, ankles, or face.  You gain 4 pounds (1.8 kg) or more in 1 week.  You vomit repeatedly.  You have vaginal bleeding.  You do not feel the baby moving as much.  You have a headache.  You have blurred or double vision.  You have muscle twitching or spasms.  You have shortness of breath.  You have blue fingernails and lips.  You have blood in your urine. MAKE SURE YOU:  Understand these instructions.  Will watch your condition.  Will get help right away if you are not doing well or get worse. Document Released: 06/07/2011 Document Revised: 07/10/2013 Document Reviewed: 04/18/2013 [facility] Patient Information 2014 [place]ExitCare, [place]LLC.

## 2014-01-31 NOTE — MAU Note (Signed)
[facility]

## 2014-01-31 NOTE — Progress Notes (Signed)
[facility]  Indication: 28 yr old G2P1001 at 763w3d with fetus with single umbilical artery for fetal growth.  Findings: 1. Single intrauterine pregnancy. 2. Estimated fetal weight is in the 26th%; the abdominal circumference is in the 6th%. 3. Posterior placenta without evidence of previa. 4. Normal amniotic fluid index. 5. Again seen is a single umbilical artery. 6. The remainder of the limited anatomy survey is normal. 7. Normal umbilical artery Doppler studies.  Recommendations: 1. Overall appropriate fetal growth: - lagging abdominal circumference- interval growth is appropriate; normal Dopplers - recommend fetal growth in 3 weeks 2. Single umbilical artery: - previously counseled - had normal fetal echocardiogram - recommend fetal growth every 3-4 weeks 3. Patient had mild range blood pressures in our office and reports has had some elevated BPs in Dr. Ferdinand CavaArus' office: - based on this likely meets criteria for gestational hypertension (denies history of chronic hypertension) - sent to MAU for evaluation for preeclampsia - if meets criteria for gestational hypertension:  - recommend initiate antenatal testing - recommend course of betamethasone - recommend weekly CBC, AST, ALT, BUN, creatinine - recommend close surveillance for the development of signs/symptoms of severe gestational hypertension or severe preeclampsia - recommend delivery at 37 weeks or sooner for severe diseased or otherwise clinically indicated  Discussed with Dr. Ferdinand CavaArus and Dr. Jolayne Pantheronstant and patient sent to MAU  Eulis FosterKristen Riyaan Heroux, MD

## 2014-02-03 ENCOUNTER — Other Ambulatory Visit (HOSPITAL_COMMUNITY): Payer: Self-pay | Admitting: Obstetrics and Gynecology

## 2014-02-03 DIAGNOSIS — O365990 Maternal care for other known or suspected poor fetal growth, unspecified trimester, not applicable or unspecified: Secondary | ICD-10-CM

## 2014-02-03 DIAGNOSIS — O3421 Maternal care for scar from previous cesarean delivery: Secondary | ICD-10-CM

## 2014-02-03 DIAGNOSIS — E669 Obesity, unspecified: Secondary | ICD-10-CM

## 2014-02-03 DIAGNOSIS — IMO0001 Reserved for inherently not codable concepts without codable children: Secondary | ICD-10-CM

## 2014-02-03 DIAGNOSIS — O99210 Obesity complicating pregnancy, unspecified trimester: Secondary | ICD-10-CM

## 2014-02-07 ENCOUNTER — Ambulatory Visit (HOSPITAL_COMMUNITY): Payer: Medicaid Other | Attending: Obstetrics and Gynecology

## 2014-02-12 ENCOUNTER — Other Ambulatory Visit (HOSPITAL_COMMUNITY): Payer: Self-pay | Admitting: Obstetrics and Gynecology

## 2014-02-12 DIAGNOSIS — O3421 Maternal care for scar from previous cesarean delivery: Secondary | ICD-10-CM

## 2014-02-12 DIAGNOSIS — IMO0001 Reserved for inherently not codable concepts without codable children: Secondary | ICD-10-CM

## 2014-02-12 DIAGNOSIS — O365990 Maternal care for other known or suspected poor fetal growth, unspecified trimester, not applicable or unspecified: Secondary | ICD-10-CM

## 2014-02-12 DIAGNOSIS — E669 Obesity, unspecified: Secondary | ICD-10-CM

## 2014-02-12 DIAGNOSIS — O99210 Obesity complicating pregnancy, unspecified trimester: Secondary | ICD-10-CM

## 2014-02-18 ENCOUNTER — Ambulatory Visit (HOSPITAL_COMMUNITY)
Admission: RE | Admit: 2014-02-18 | Discharge: 2014-02-18 | Disposition: A | Discharge status: Home / Self Care | Payer: Medicaid Other | Source: Ambulatory Visit | Attending: Obstetrics and Gynecology | Admitting: Obstetrics and Gynecology

## 2014-02-18 ENCOUNTER — Other Ambulatory Visit (HOSPITAL_COMMUNITY): Payer: Self-pay | Admitting: Maternal and Fetal Medicine

## 2014-02-18 ENCOUNTER — Encounter (HOSPITAL_COMMUNITY): Payer: Self-pay

## 2014-02-18 DIAGNOSIS — O34219 Maternal care for unspecified type scar from previous cesarean delivery: Secondary | ICD-10-CM | POA: Insufficient documentation

## 2014-02-18 DIAGNOSIS — O3421 Maternal care for scar from previous cesarean delivery: Secondary | ICD-10-CM

## 2014-02-18 DIAGNOSIS — E669 Obesity, unspecified: Secondary | ICD-10-CM | POA: Insufficient documentation

## 2014-02-18 DIAGNOSIS — O99210 Obesity complicating pregnancy, unspecified trimester: Secondary | ICD-10-CM

## 2014-02-18 DIAGNOSIS — Z3689 Encounter for other specified antenatal screening: Secondary | ICD-10-CM | POA: Insufficient documentation

## 2014-02-18 DIAGNOSIS — O365990 Maternal care for other known or suspected poor fetal growth, unspecified trimester, not applicable or unspecified: Secondary | ICD-10-CM | POA: Insufficient documentation

## 2014-02-18 DIAGNOSIS — IMO0001 Reserved for inherently not codable concepts without codable children: Secondary | ICD-10-CM | POA: Insufficient documentation

## 2014-02-18 DIAGNOSIS — Q270 Congenital absence and hypoplasia of umbilical artery: Secondary | ICD-10-CM

## 2014-02-25 ENCOUNTER — Ambulatory Visit (HOSPITAL_COMMUNITY): Payer: Medicaid Other

## 2014-02-25 ENCOUNTER — Ambulatory Visit (HOSPITAL_COMMUNITY)
Admission: RE | Admit: 2014-02-25 | Discharge: 2014-02-25 | Disposition: A | Discharge status: Home / Self Care | Payer: Medicaid Other | Source: Ambulatory Visit | Attending: Obstetrics and Gynecology | Admitting: Obstetrics and Gynecology

## 2014-02-25 ENCOUNTER — Encounter (HOSPITAL_COMMUNITY): Payer: Self-pay

## 2014-02-25 DIAGNOSIS — O99891 Other specified diseases and conditions complicating pregnancy: Secondary | ICD-10-CM | POA: Insufficient documentation

## 2014-02-25 DIAGNOSIS — R030 Elevated blood-pressure reading, without diagnosis of hypertension: Secondary | ICD-10-CM | POA: Insufficient documentation

## 2014-02-25 DIAGNOSIS — Q270 Congenital absence and hypoplasia of umbilical artery: Secondary | ICD-10-CM | POA: Insufficient documentation

## 2014-02-25 DIAGNOSIS — O9989 Other specified diseases and conditions complicating pregnancy, childbirth and the puerperium: Secondary | ICD-10-CM

## 2014-02-25 NOTE — Progress Notes (Addendum)
[facility]  Indication: 28 yr old G2P1001 at [redacted]w[redacted]d with fetus with single umbilical artery, previous finding of lagging growth, and possible gestational hypertension for fetal growth and BPP.  Findings: 1. Single intrauterine pregnancy. 2. Estimated fetal weight is in the 30th%. 3. Posterior placenta without evidence of previa. 4. Normal amniotic fluid index. 5. Again seen is a single umbilical artery. 6. The remainder of the limited anatomy survey is normal. 7. Normal biophysical profile of 8/8.  Recommendations: 1. Appropriate fetal growth: 2. Single umbilical artery: - previously counseled - had normal fetal echocardiogram - recommend fetal growth every 3-4 weeks 3. Patient previously had mild range blood pressures in our office and reports has had some elevated BPs in Dr. Ferdinand Cava' office: - based on this likely meets criteria for gestational hypertension (denies history of chronic hypertension- although I do not have access to other BPs) - sent to MAU for evaluation for preeclampsia - if meets criteria for gestational hypertension:  - recommend weekly CBC, AST, ALT, BUN, creatinine - recommend close surveillance for the development of signs/symptoms of severe gestational hypertension or severe preeclampsia - recommend delivery at 37 weeks or sooner for severe diseased or otherwise clinically indicated  - recommend continue antenatal testing  Patient desires to transfer to the high risk clinic- assisted in this  BP normal today (although at top end of normal) Patient missed appointment with Dr. Ferdinand Cava- discussed importance of keeping prenatal appointments especially with elevated BP. Patient has appointment 5/29  Eulis Foster, MD

## 2014-03-03 ENCOUNTER — Encounter: Payer: Self-pay | Admitting: Obstetrics & Gynecology

## 2014-03-03 ENCOUNTER — Ambulatory Visit (INDEPENDENT_AMBULATORY_CARE_PROVIDER_SITE_OTHER): Payer: Medicaid Other | Admitting: Obstetrics & Gynecology

## 2014-03-03 VITALS — BP 150/86 | HR 89 | Temp 98.2°F | Wt 317.8 lb

## 2014-03-03 DIAGNOSIS — O133 Gestational [pregnancy-induced] hypertension without significant proteinuria, third trimester: Secondary | ICD-10-CM | POA: Insufficient documentation

## 2014-03-03 DIAGNOSIS — O99810 Abnormal glucose complicating pregnancy: Secondary | ICD-10-CM

## 2014-03-03 DIAGNOSIS — O139 Gestational [pregnancy-induced] hypertension without significant proteinuria, unspecified trimester: Secondary | ICD-10-CM

## 2014-03-03 LAB — POCT URINALYSIS DIP (DEVICE)
BILIRUBIN URINE: NEGATIVE
GLUCOSE, UA: NEGATIVE mg/dL
Hgb urine dipstick: NEGATIVE
Ketones, ur: NEGATIVE mg/dL
Leukocytes, UA: NEGATIVE
NITRITE: NEGATIVE
Protein, ur: NEGATIVE mg/dL
SPECIFIC GRAVITY, URINE: 1.02 (ref 1.005–1.030)
Urobilinogen, UA: 0.2 mg/dL (ref 0.0–1.0)
pH: 7 (ref 5.0–8.0)

## 2014-03-03 NOTE — Progress Notes (Signed)
Referred here for prenatal care from [facility]FM for htn and gdm. Was receiving prenatal care in [facility] at [facility]. Patient  States wanted to transfer here because coming to MFM. Given new patient booklets.

## 2014-03-03 NOTE — Patient Instructions (Signed)
Third Trimester of Pregnancy  The third trimester is from week 29 through week 42, months 7 through 9. The third trimester is a time when the fetus is growing rapidly. At the end of the ninth month, the fetus is about 20 inches in length and weighs 6 10 pounds.   BODY CHANGES  Your body goes through many changes during pregnancy. The changes vary from woman to woman.    Your weight will continue to increase. You can expect to gain 25 35 pounds (11 16 kg) by the end of the pregnancy.   You may begin to get stretch marks on your hips, abdomen, and breasts.   You may urinate more often because the fetus is moving lower into your pelvis and pressing on your bladder.   You may develop or continue to have heartburn as a result of your pregnancy.   You may develop constipation because certain hormones are causing the muscles that push waste through your intestines to slow down.   You may develop hemorrhoids or swollen, bulging veins (varicose veins).   You may have pelvic pain because of the weight gain and pregnancy hormones relaxing your joints between the bones in your pelvis. Back aches may result from over exertion of the muscles supporting your posture.   Your breasts will continue to grow and be tender. A yellow discharge may leak from your breasts called colostrum.   Your belly button may stick out.   You may feel short of breath because of your expanding uterus.   You may notice the fetus "dropping," or moving lower in your abdomen.   You may have a bloody mucus discharge. This usually occurs a few days to a week before labor begins.   Your cervix becomes thin and soft (effaced) near your due date.  WHAT TO EXPECT AT YOUR PRENATAL EXAMS   You will have prenatal exams every 2 weeks until week 36. Then, you will have weekly prenatal exams. During a routine prenatal visit:   You will be weighed to make sure you and the fetus are growing normally.   Your blood pressure is taken.   Your abdomen will be  measured to track your baby's growth.   The fetal heartbeat will be listened to.   Any test results from the previous visit will be discussed.   You may have a cervical check near your due date to see if you have effaced.  At around 36 weeks, your caregiver will check your cervix. At the same time, your caregiver will also perform a test on the secretions of the vaginal tissue. This test is to determine if a type of bacteria, Group B streptococcus, is present. Your caregiver will explain this further.  Your caregiver may ask you:   What your birth plan is.   How you are feeling.   If you are feeling the baby move.   If you have had any abnormal symptoms, such as leaking fluid, bleeding, severe headaches, or abdominal cramping.   If you have any questions.  Other tests or screenings that may be performed during your third trimester include:   Blood tests that check for low iron levels (anemia).   Fetal testing to check the health, activity level, and growth of the fetus. Testing is done if you have certain medical conditions or if there are problems during the pregnancy.  FALSE LABOR  You may feel small, irregular contractions that eventually go away. These are called Braxton Hicks contractions, or   false labor. Contractions may last for hours, days, or even weeks before true labor sets in. If contractions come at regular intervals, intensify, or become painful, it is best to be seen by your caregiver.   SIGNS OF LABOR    Menstrual-like cramps.   Contractions that are 5 minutes apart or less.   Contractions that start on the top of the uterus and spread down to the lower abdomen and back.   A sense of increased pelvic pressure or back pain.   A watery or bloody mucus discharge that comes from the vagina.  If you have any of these signs before the 37th week of pregnancy, call your caregiver right away. You need to go to the hospital to get checked immediately.  HOME CARE INSTRUCTIONS    Avoid all  smoking, herbs, alcohol, and unprescribed drugs. These chemicals affect the formation and growth of the baby.   Follow your caregiver's instructions regarding medicine use. There are medicines that are either safe or unsafe to take during pregnancy.   Exercise only as directed by your caregiver. Experiencing uterine cramps is a good sign to stop exercising.   Continue to eat regular, healthy meals.   Wear a good support bra for breast tenderness.   Do not use hot tubs, steam rooms, or saunas.   Wear your seat belt at all times when driving.   Avoid raw meat, uncooked cheese, cat litter boxes, and soil used by cats. These carry germs that can cause birth defects in the baby.   Take your prenatal vitamins.   Try taking a stool softener (if your caregiver approves) if you develop constipation. Eat more high-fiber foods, such as fresh vegetables or fruit and whole grains. Drink plenty of fluids to keep your urine clear or pale yellow.   Take warm sitz baths to soothe any pain or discomfort caused by hemorrhoids. Use hemorrhoid cream if your caregiver approves.   If you develop varicose veins, wear support hose. Elevate your feet for 15 minutes, 3 4 times a day. Limit salt in your diet.   Avoid heavy lifting, wear low heal shoes, and practice good posture.   Rest a lot with your legs elevated if you have leg cramps or low back pain.   Visit your dentist if you have not gone during your pregnancy. Use a soft toothbrush to brush your teeth and be gentle when you floss.   A sexual relationship may be continued unless your caregiver directs you otherwise.   Do not travel far distances unless it is absolutely necessary and only with the approval of your caregiver.   Take prenatal classes to understand, practice, and ask questions about the labor and delivery.   Make a trial run to the hospital.   Pack your hospital bag.   Prepare the baby's nursery.   Continue to go to all your prenatal visits as directed  by your caregiver.  SEEK MEDICAL CARE IF:   You are unsure if you are in labor or if your water has broken.   You have dizziness.   You have mild pelvic cramps, pelvic pressure, or nagging pain in your abdominal area.   You have persistent nausea, vomiting, or diarrhea.   You have a bad smelling vaginal discharge.   You have pain with urination.  SEEK IMMEDIATE MEDICAL CARE IF:    You have a fever.   You are leaking fluid from your vagina.   You have spotting or bleeding from your vagina.     You have severe abdominal cramping or pain.   You have rapid weight loss or gain.   You have shortness of breath with chest pain.   You notice sudden or extreme swelling of your face, hands, ankles, feet, or legs.   You have not felt your baby move in over an hour.   You have severe headaches that do not go away with medicine.   You have vision changes.  Document Released: 09/13/2001 Document Revised: 05/22/2013 Document Reviewed: 11/20/2012  ExitCare Patient Information 2014 [employer].

## 2014-03-03 NOTE — Progress Notes (Signed)
Transfer care from Dr. Ferdinand Cava in [facility], has seen [facility] with growth [place] for possible GHTN, had abnl 1 hr GTT but 3 hr not done, 2 vessel cord. EFW was 30 % last check. Records for HP in chart.  Subjective: transfer care    Sara Keith is a G2P1001 [redacted]w[redacted]d being seen today for her first obstetrical visit.  Her obstetrical history is significant for abnl 1 hr GTT and possible gest htn, 2 vessel cord. Patient does intend to breast feed. Pregnancy history fully reviewed.  Patient reports no complaints.  Filed Vitals:   03/03/14 0942  BP: 150/86  Pulse: 89  Temp: 98.2 F (36.8 C)  Weight: 144.153 kg (317 lb 12.8 oz)    HISTORY: OB History  Gravida Para Term Preterm AB SAB TAB Ectopic Multiple Living  2 1 1       1     # Outcome Date GA Lbr Len/2nd Weight Sex Delivery Anes PTL Lv  2 CUR           1 TRM 2009 [redacted]w[redacted]d  2.551 kg (5 lb 10 oz) F CS EPI  Y     Comments: Induced for SGA, born at [facility]. C/S FTP     Past Medical History  Diagnosis Date  . Gestational diabetes   . Pregnancy induced hypertension    Past Surgical History  Procedure Laterality Date  . Sweat glan    . Cesarean section     Family History  Problem Relation Age of Onset  . Kidney disease Mother   . Diabetes Father      Exam    Uterus:     Pelvic Exam:                pH:     Cervix:     Adnexa: not evaluated   Bony Pelvis:    System: Breast:      Skin: normal coloration and turgor, no rashes    Neurologic: oriented, normal   Extremities: normal strength, tone, and muscle mass   HEENT neck supple with midline trachea   Mouth/Teeth mucous membranes moist, pharynx normal without lesions   Neck supple   Cardiovascular:     Respiratory:  appears well, vitals normal, no respiratory distress, acyanotic, normal RR   Abdomen: obese and gravid   Urinary:        Assessment:    Pregnancy: G2P1001 Patient Active Problem List   Diagnosis Date Noted  . Abnormal maternal glucose  tolerance, antepartum 03/03/2014  . Gestational hypertension without significant proteinuria in third trimester 03/03/2014        Plan:     Initial labs drawn. Prenatal vitamins. Problem list reviewed and updated.   Ultrasound discussed; fetal survey: results reviewed.  Follow up in 1 weeks. 50 %  of 30 min visit spent on counseling and coordination of care.  NST and AFI   Adam Phenix 03/03/2014

## 2014-03-03 NOTE — Progress Notes (Signed)
Nutrition note: 1st visit consult Pt has h/o obesity. Pt had an abnormal 1hr GTT so doing GDM diet education today. Pt has gained 57.8# @ [redacted]w[redacted]d, which is >> expected. Pt reports eating 2 meals & 2-3 snacks/d. Pt is taking a PNV.  Pt reports occ nausea but no heartburn or vomiting. NKFA. Pt is walking 3x/wk. Pt received verbal & written education on GDM diet. Discussed tips to decrease nausea. Discussed wt gain goals of 11-20# or 0.5#/wk. Pt agrees to follow GDM diet with 3 meals & 2-3 meals/d and proper CHO/ protein combination. Pt has WIC & plans to BF. F/u in 2-4 wks Blondell Reveal, MS, RD, LDN, [facility]

## 2014-03-03 NOTE — Progress Notes (Signed)
Pt has questions that she has HTN in pregnancy or GDM.  She states she missed her appointment for 3 hour glucose test.

## 2014-03-04 ENCOUNTER — Other Ambulatory Visit (HOSPITAL_COMMUNITY): Payer: Self-pay | Admitting: Obstetrics and Gynecology

## 2014-03-04 DIAGNOSIS — O99210 Obesity complicating pregnancy, unspecified trimester: Secondary | ICD-10-CM

## 2014-03-04 DIAGNOSIS — O365990 Maternal care for other known or suspected poor fetal growth, unspecified trimester, not applicable or unspecified: Secondary | ICD-10-CM

## 2014-03-04 DIAGNOSIS — E669 Obesity, unspecified: Secondary | ICD-10-CM

## 2014-03-04 DIAGNOSIS — IMO0001 Reserved for inherently not codable concepts without codable children: Secondary | ICD-10-CM

## 2014-03-04 DIAGNOSIS — O3421 Maternal care for scar from previous cesarean delivery: Secondary | ICD-10-CM

## 2014-03-05 ENCOUNTER — Ambulatory Visit (HOSPITAL_COMMUNITY)
Admission: RE | Admit: 2014-03-05 | Discharge: 2014-03-05 | Disposition: A | Discharge status: Home / Self Care | Payer: Medicaid Other | Source: Ambulatory Visit | Attending: Obstetrics and Gynecology | Admitting: Obstetrics and Gynecology

## 2014-03-05 ENCOUNTER — Encounter (HOSPITAL_COMMUNITY): Payer: Self-pay

## 2014-03-05 VITALS — BP 137/81 | HR 95 | Wt 320.5 lb

## 2014-03-05 DIAGNOSIS — E669 Obesity, unspecified: Secondary | ICD-10-CM | POA: Insufficient documentation

## 2014-03-05 DIAGNOSIS — O99210 Obesity complicating pregnancy, unspecified trimester: Secondary | ICD-10-CM

## 2014-03-05 DIAGNOSIS — O3421 Maternal care for scar from previous cesarean delivery: Secondary | ICD-10-CM

## 2014-03-05 DIAGNOSIS — O34219 Maternal care for unspecified type scar from previous cesarean delivery: Secondary | ICD-10-CM | POA: Insufficient documentation

## 2014-03-05 DIAGNOSIS — O365990 Maternal care for other known or suspected poor fetal growth, unspecified trimester, not applicable or unspecified: Secondary | ICD-10-CM

## 2014-03-05 DIAGNOSIS — IMO0001 Reserved for inherently not codable concepts without codable children: Secondary | ICD-10-CM | POA: Insufficient documentation

## 2014-03-05 DIAGNOSIS — O99810 Abnormal glucose complicating pregnancy: Secondary | ICD-10-CM

## 2014-03-06 ENCOUNTER — Other Ambulatory Visit: Payer: Medicaid Other

## 2014-03-10 ENCOUNTER — Other Ambulatory Visit: Payer: Medicaid Other

## 2014-03-13 ENCOUNTER — Ambulatory Visit (INDEPENDENT_AMBULATORY_CARE_PROVIDER_SITE_OTHER): Payer: Medicaid Other | Admitting: *Deleted

## 2014-03-13 VITALS — BP 145/81 | HR 92

## 2014-03-13 DIAGNOSIS — O133 Gestational [pregnancy-induced] hypertension without significant proteinuria, third trimester: Secondary | ICD-10-CM

## 2014-03-13 DIAGNOSIS — O139 Gestational [pregnancy-induced] hypertension without significant proteinuria, unspecified trimester: Secondary | ICD-10-CM

## 2014-03-13 LAB — US OB FOLLOW UP

## 2014-03-13 NOTE — Progress Notes (Signed)
NST reactive on 03/13/14 

## 2014-03-17 ENCOUNTER — Ambulatory Visit (INDEPENDENT_AMBULATORY_CARE_PROVIDER_SITE_OTHER): Payer: Medicaid Other | Admitting: Obstetrics & Gynecology

## 2014-03-17 VITALS — BP 120/84 | HR 102 | Temp 97.3°F | Wt 320.3 lb

## 2014-03-17 DIAGNOSIS — L02419 Cutaneous abscess of limb, unspecified: Secondary | ICD-10-CM

## 2014-03-17 DIAGNOSIS — Z3480 Encounter for supervision of other normal pregnancy, unspecified trimester: Secondary | ICD-10-CM

## 2014-03-17 DIAGNOSIS — O99810 Abnormal glucose complicating pregnancy: Secondary | ICD-10-CM

## 2014-03-17 DIAGNOSIS — L02415 Cutaneous abscess of right lower limb: Secondary | ICD-10-CM

## 2014-03-17 DIAGNOSIS — L03119 Cellulitis of unspecified part of limb: Secondary | ICD-10-CM

## 2014-03-17 DIAGNOSIS — Z3490 Encounter for supervision of normal pregnancy, unspecified, unspecified trimester: Secondary | ICD-10-CM

## 2014-03-17 DIAGNOSIS — O133 Gestational [pregnancy-induced] hypertension without significant proteinuria, third trimester: Secondary | ICD-10-CM

## 2014-03-17 DIAGNOSIS — O139 Gestational [pregnancy-induced] hypertension without significant proteinuria, unspecified trimester: Secondary | ICD-10-CM

## 2014-03-17 LAB — POCT URINALYSIS DIP (DEVICE)
BILIRUBIN URINE: NEGATIVE
Glucose, UA: NEGATIVE mg/dL
Hgb urine dipstick: NEGATIVE
KETONES UR: NEGATIVE mg/dL
LEUKOCYTES UA: NEGATIVE
Nitrite: NEGATIVE
Protein, ur: 30 mg/dL — AB
Specific Gravity, Urine: 1.02 (ref 1.005–1.030)
Urobilinogen, UA: 0.2 mg/dL (ref 0.0–1.0)
pH: 7 (ref 5.0–8.0)

## 2014-03-17 LAB — OB RESULTS CONSOLE GBS: STREP GROUP B AG: POSITIVE

## 2014-03-17 MED ORDER — CEPHALEXIN 500 MG PO CAPS: 500.0000 mg | ORAL_CAPSULE | Freq: Four times a day (QID) | ORAL | Status: DC

## 2014-03-17 MED ORDER — ACETAMINOPHEN-CODEINE #3 300-30 MG PO TABS: 1.0000 | ORAL_TABLET | ORAL | Status: DC | PRN

## 2014-03-17 NOTE — Patient Instructions (Addendum)
Return to clinic for any obstetric concerns or go to MAU for evaluation Trial of Labor After Cesarean Delivery Information A trial of labor after cesarean delivery (TOLAC) is when a woman tries to give birth vaginally after a previous cesarean delivery. TOLAC may be a safe and appropriate option for you depending on your medical history and other risk factors. When TOLAC is successful and you are able to have a vaginal delivery, this is called a vaginal birth after cesarean delivery (VBAC).  CANDIDATES FOR TOLAC TOLAC is possible for some women who:  Have undergone one or two prior cesarean deliveries in which the incision of the uterus was horizontal (low transverse).  Are carrying twins and have had one prior low transverse incision during a cesarean delivery.  Do not have a vertical (classical) uterine scar.  Have not had a tear in the wall of their uterus (uterine rupture). TOLAC is also supported for women who meet appropriate criteria and:  Are under the age of 66 years.  Are tall and have a body mass index (BMI) of less than 30.  Have an unknown uterine scar.  Give birth in a facility equipped to handle an emergency cesarean delivery. This team should be able to handle possible complications such as a uterine rupture.  Have thorough counseling about the benefits and risks of TOLAC.  Have discussed future pregnancy plans with their health care provider.  Plan to have several more pregnancies. MOST SUCCESSFUL CANDIDATES FOR TOLAC:  Have had a successful vaginal delivery before or after their cesarean delivery.  Experience labor that begins naturally on or before the due date (40 weeks of gestation).  Do not have a very large (macrosomic) baby.   Had a prior cesarean delivery but are not currently experiencing factors that would prompt a cesarean delivery (such as a breech position).  Had only one prior cesarean delivery.  Had a prior cesarean delivery that was performed  early in labor and not after full cervical dilation. TOLAC may be most appropriate for women who meet the above guidelines and who plan to have more pregnancies. TOLAC is not recommended for home births. LEAST SUCCESSFUL CANDIDATES FOR TOLAC:  Have an induced labor with an unfavorable cervix. An unfavorable cervix is when the cervix is not dilating enough (among other factors).  Have never had a vaginal delivery.  Have had more than two cesarean deliveries.  Have a pregnancy at more than 40 weeks of gestation.  Are pregnant with a baby with a suspected weight greater than 4,000 grams (8 pounds) and who have no prior history of a vaginal delivery.  Have closely spaced pregnancies. SUGGESTED BENEFITS OF TOLAC  You may have a faster recovery time.  You may have a shorter stay in the hospital.  You may have less pain and fewer problems than with a cesarean delivery. Women who have a cesarean delivery have a higher chance of needing blood or getting a fever, an infection, or a blood clot in the legs. SUGGESTED RISKS OF TOLAC The highest risk of complications happens to women who attempt a TOLAC and fail. A failed TOLAC results in an unplanned cesarean delivery. Risks related to [facility] or repeat cesarean deliveries include:   Blood loss.  Infection.  Blood clot.  Injury to surrounding tissues or organs.  Having to remove the uterus (hysterectomy).  Potential problems with the placenta (such as placenta previa or placenta accreta) in future pregnancies. Although very rare, the main concerns with TOLAC are:  Rupture of the uterine scar from a past cesarean delivery.  Needing an emergency cesarean delivery.  Having a bad outcome for the baby (perinatal morbidity). FOR MORE INFORMATION American Congress of Obstetricians and Gynecologists: www.acog.org [employer]: www.midwife.org Document Released: 06/07/2011 Document Revised: 07/10/2013 Document Reviewed:  03/11/2013 [facility] LPExit[facility] Patient Information 2014 [place]ExitCare, [place]LLC.

## 2014-03-17 NOTE — Progress Notes (Signed)
Pt reports large bump on vaginal area/ 3 hour glucose test/NST

## 2014-03-17 NOTE — Progress Notes (Signed)
Large 5 cm boil on R inner thigh, fluctuant and erythematous. Patient consented for I&D. Boil was prepeed with Betadine, local analgesia given with 1% lidocaine, 5 mm incision made with scalpel. Copious purulent drainage expressed.  Given Tylenol #3, Keflex. Will reevaluate at next visit. EFW 02/25/14 1976 g/30%; 3 hr GTT being done today; will follow up results and manage accordingly. Patient unsure about RCS vs VBAC; paper given to her to review at home, will decide and let us know.  Closed cervix on exam today. Patient denies history of prior elevated blood pressures prior to pregnancy; BP today 120/84. NST performed today was reviewed and was found to be reactive.  Continue recommended antenatal testing and prenatal care.  If BP and antenatal testing stable, may consider delivery at 39 weeks. If not, may consider delivery earlier.   If BP gets concerning, or for any other indications, may need earlier delivery. Pelvic cultures done today.  No other complaints or concerns.  Fetal movement and labor precautions reviewed.

## 2014-03-18 ENCOUNTER — Encounter: Payer: Self-pay | Admitting: Obstetrics & Gynecology

## 2014-03-18 ENCOUNTER — Telehealth: Payer: Self-pay

## 2014-03-18 DIAGNOSIS — O24419 Gestational diabetes mellitus in pregnancy, unspecified control: Secondary | ICD-10-CM | POA: Insufficient documentation

## 2014-03-18 LAB — GLUCOSE TOLERANCE, 3 HOURS
GLUCOSE, FASTING-GESTATIONAL: 99 mg/dL (ref 70–104)
Glucose Tolerance, 1 hour: 208 mg/dL — ABNORMAL HIGH (ref 70–189)
Glucose Tolerance, 2 hour: 149 mg/dL (ref 70–164)
Glucose, GTT - 3 Hour: 116 mg/dL (ref 70–144)

## 2014-03-18 NOTE — Telephone Encounter (Signed)
Called patient and informed her of results and the need to come in for diabetic education. Unable to schedule patient for education this Thursday with nancy in M[facility] as she will not be there. Explained to patient she will need to come next Monday for education as that is the only time we have educator here in clinic.  Patient verbalizes frustration about having multiple appointments a week and states she will not come two times this week or next week. Empathized with patient and explained the importance of education and obtaining meter to check and monitor sugars for the health of herself and baby. Asked patient when she might be able to come in Monday. Patient states she will not come Monday and Tuesday (NST Tuesday). Informed patient she can discuss an appointment for Monday when she comes to NST this Thursday and we can schedule an appointment for education at that time. Patient verbalized understanding. No further questions or concerns. Diane Day, RNC notified to discuss DM education appointment with patient during NST.

## 2014-03-18 NOTE — Telephone Encounter (Signed)
Message copied by Louanna RawAMPBELL, Fitzpatrick Alberico M on Tue Mar 18, 2014  2:12 PM ------      Message from: Jaynie CollinsANYANWU, UGONNA A      Created: Tue Mar 18, 2014 12:53 PM       Abnormal 3 hr GTT -> has GDM. Needs DM education etc. Please call to inform patient of results and recommendations. Already in testing for gHTN.       ------

## 2014-03-18 NOTE — Telephone Encounter (Signed)
I called pt back to see if she would be able to go for appt @ [facility] tomorrow from 0900-1100. She said she could not because she has to arrange for Medicaid transportation at least 48 hrs in advance. I rescheduled pt's clinic appt from 6/23 to 6/22 @ 0900. At that appt she will meet with the Diabetes Educator RN and receive glucometer and CBG training as well as have NST and prenatal visit.  I advised her to expect to be here for 2-3 hrs for the appt. Pt agreed to appt and voiced understanding.

## 2014-03-19 LAB — GC/CHLAMYDIA PROBE AMP
CT PROBE, AMP APTIMA: NEGATIVE
GC PROBE AMP APTIMA: NEGATIVE

## 2014-03-20 ENCOUNTER — Ambulatory Visit (INDEPENDENT_AMBULATORY_CARE_PROVIDER_SITE_OTHER): Payer: Medicaid Other | Admitting: *Deleted

## 2014-03-20 DIAGNOSIS — O34219 Maternal care for unspecified type scar from previous cesarean delivery: Secondary | ICD-10-CM | POA: Insufficient documentation

## 2014-03-20 DIAGNOSIS — O09899 Supervision of other high risk pregnancies, unspecified trimester: Secondary | ICD-10-CM | POA: Insufficient documentation

## 2014-03-20 DIAGNOSIS — O24419 Gestational diabetes mellitus in pregnancy, unspecified control: Secondary | ICD-10-CM

## 2014-03-20 DIAGNOSIS — O133 Gestational [pregnancy-induced] hypertension without significant proteinuria, third trimester: Secondary | ICD-10-CM

## 2014-03-20 DIAGNOSIS — O139 Gestational [pregnancy-induced] hypertension without significant proteinuria, unspecified trimester: Secondary | ICD-10-CM

## 2014-03-20 DIAGNOSIS — O99810 Abnormal glucose complicating pregnancy: Secondary | ICD-10-CM

## 2014-03-20 LAB — CULTURE, BETA STREP (GROUP B ONLY)

## 2014-03-20 LAB — US OB FOLLOW UP

## 2014-03-20 NOTE — Progress Notes (Signed)
US for growth and NST on 6/26 @ [facility] @ 3pm

## 2014-03-24 ENCOUNTER — Encounter: Payer: Medicaid Other | Attending: Obstetrics and Gynecology | Admitting: *Deleted

## 2014-03-24 ENCOUNTER — Encounter: Payer: Self-pay | Admitting: Obstetrics and Gynecology

## 2014-03-24 ENCOUNTER — Ambulatory Visit (INDEPENDENT_AMBULATORY_CARE_PROVIDER_SITE_OTHER): Payer: Medicaid Other | Admitting: Obstetrics and Gynecology

## 2014-03-24 VITALS — BP 136/84 | HR 94 | Temp 98.2°F | Wt 321.3 lb

## 2014-03-24 DIAGNOSIS — O3090 Multiple gestation, unspecified, unspecified trimester: Secondary | ICD-10-CM

## 2014-03-24 DIAGNOSIS — IMO0001 Reserved for inherently not codable concepts without codable children: Secondary | ICD-10-CM | POA: Diagnosis not present

## 2014-03-24 DIAGNOSIS — O99810 Abnormal glucose complicating pregnancy: Secondary | ICD-10-CM | POA: Insufficient documentation

## 2014-03-24 DIAGNOSIS — O139 Gestational [pregnancy-induced] hypertension without significant proteinuria, unspecified trimester: Secondary | ICD-10-CM | POA: Diagnosis not present

## 2014-03-24 DIAGNOSIS — O34219 Maternal care for unspecified type scar from previous cesarean delivery: Secondary | ICD-10-CM

## 2014-03-24 DIAGNOSIS — Z713 Dietary counseling and surveillance: Secondary | ICD-10-CM | POA: Insufficient documentation

## 2014-03-24 DIAGNOSIS — O24419 Gestational diabetes mellitus in pregnancy, unspecified control: Secondary | ICD-10-CM

## 2014-03-24 DIAGNOSIS — O133 Gestational [pregnancy-induced] hypertension without significant proteinuria, third trimester: Secondary | ICD-10-CM

## 2014-03-24 LAB — POCT URINALYSIS DIP (DEVICE)
Bilirubin Urine: NEGATIVE
Glucose, UA: NEGATIVE mg/dL
HGB URINE DIPSTICK: NEGATIVE
Ketones, ur: NEGATIVE mg/dL
Leukocytes, UA: NEGATIVE
Nitrite: NEGATIVE
PH: 7 (ref 5.0–8.0)
PROTEIN: 30 mg/dL — AB
SPECIFIC GRAVITY, URINE: 1.02 (ref 1.005–1.030)
UROBILINOGEN UA: 0.2 mg/dL (ref 0.0–1.0)

## 2014-03-24 MED ORDER — GLUCOSE BLOOD VI STRP: ORAL_STRIP | Status: DC

## 2014-03-24 MED ORDER — ACCU-CHEK FASTCLIX LANCETS MISC: 1.0000 | Freq: Four times a day (QID) | Status: DC

## 2014-03-24 NOTE — Progress Notes (Signed)
DIABETES:  Patient was seen on 03/24/14 for Gestational Diabetes self-management . The following learning objectives were met by the patient :   States the definition of Gestational Diabetes  States when to check blood glucose levels  Demonstrates proper blood glucose monitoring techniques  States the effect of stress and exercise on blood glucose levels  States the importance of limiting caffeine and abstaining from alcohol and smoking  Plan:  Consider  increasing your activity level by walking daily as tolerated Begin checking BG before breakfast and 2 hours after first bit of breakfast, lunch and dinner after  as directed by MD  Take medication  as directed by MD  Blood glucose monitor given: accu-ck nano Lot # [id number] Exp: 10/02/14 Blood glucose reading: 120  2hpp  Patient instructed to monitor glucose levels: FBS: 60 - <90 2 hour: <120  Patient received the following handouts:  Nutrition Diabetes and Pregnancy - previously provided by dietitian  Patient will be seen for follow-up as needed.

## 2014-03-24 NOTE — Progress Notes (Signed)
Patient is doing well without complaints. Received meter today and discussed importance of maintaining euglycemia in the next few days as she is approaching delivery. Patient desired TOLAC. Explained to patient that IOL at 39 weeks will only occur if poorly controlled diabetes or if BP become elevated or any other fetal indications. Explained to patient that she does not meet criteria at this time. Patient became visibly upset. Patient scheduled for NST on Friday and we may review her CBGs of the week to determine if she meets criteria for induction, otherwise will check next Monday. NST reviewed and reactive TOLAC consent signed

## 2014-03-24 NOTE — Progress Notes (Signed)
US for growth scheduled on 6/26,  IOL scheduled on 6/30 due to RN misunderstanding of plan of care. Pt evaluated by Dr. Jolayne Pantheronstant and does not meet criteria at this time. I explained to pt that we will keep the IOL appt in case that is needed once her CBG's are evaluated on 6/26. However if she does not require IOL @ 39 wks, she will have HOB appt on 6/29 and be scheduled for IOL @ 40 wks. Pt then stated, "I cannot be pregnant another week! This baby has been giving me problems for 9 months. If you don't induce me on Tuesday, I won't come back until I am in labor. [place] and [place]'t [place]. I will just go there when I am in labor."  I acknowledged pt's frustration and disappointment with the change in plan of care and apologized for my mistake in scheduling the IOL when she was here last week. I advised pt that [facility]. We strongly encourage her to follow the doctor's recommendations.  Pt did not respond.

## 2014-03-24 NOTE — Progress Notes (Signed)
Edema in hands and feet. Reports Intermittent pelvic pain and pressure.

## 2014-03-25 ENCOUNTER — Other Ambulatory Visit: Payer: Medicaid Other

## 2014-03-25 ENCOUNTER — Telehealth (HOSPITAL_COMMUNITY): Payer: Self-pay | Admitting: *Deleted

## 2014-03-25 NOTE — Telephone Encounter (Signed)
Preadmission screen  

## 2014-03-25 NOTE — Progress Notes (Signed)
NST 03/24/14 reactive

## 2014-03-28 ENCOUNTER — Other Ambulatory Visit: Payer: Self-pay | Admitting: Obstetrics & Gynecology

## 2014-03-28 ENCOUNTER — Ambulatory Visit (HOSPITAL_COMMUNITY): Admission: RE | Admit: 2014-03-28 | Payer: Medicaid Other | Source: Ambulatory Visit

## 2014-03-28 ENCOUNTER — Ambulatory Visit (HOSPITAL_COMMUNITY)
Admission: RE | Admit: 2014-03-28 | Discharge: 2014-03-28 | Disposition: A | Discharge status: Home / Self Care | Payer: Medicaid Other | Source: Ambulatory Visit | Attending: Obstetrics & Gynecology | Admitting: Obstetrics & Gynecology

## 2014-03-28 ENCOUNTER — Inpatient Hospital Stay (HOSPITAL_COMMUNITY)
Admission: AD | Admit: 2014-03-28 | Discharge: 2014-04-01 | DRG: 765 | Disposition: A | Discharge status: Home / Self Care | Payer: Medicaid Other | Source: Ambulatory Visit | Attending: Obstetrics & Gynecology | Admitting: Obstetrics & Gynecology

## 2014-03-28 DIAGNOSIS — Z6841 Body Mass Index (BMI) 40.0 and over, adult: Secondary | ICD-10-CM

## 2014-03-28 DIAGNOSIS — O99210 Obesity complicating pregnancy, unspecified trimester: Secondary | ICD-10-CM

## 2014-03-28 DIAGNOSIS — O24419 Gestational diabetes mellitus in pregnancy, unspecified control: Secondary | ICD-10-CM

## 2014-03-28 DIAGNOSIS — Z98891 History of uterine scar from previous surgery: Secondary | ICD-10-CM

## 2014-03-28 DIAGNOSIS — E669 Obesity, unspecified: Secondary | ICD-10-CM | POA: Diagnosis present

## 2014-03-28 DIAGNOSIS — L02415 Cutaneous abscess of right lower limb: Secondary | ICD-10-CM | POA: Diagnosis present

## 2014-03-28 DIAGNOSIS — O133 Gestational [pregnancy-induced] hypertension without significant proteinuria, third trimester: Secondary | ICD-10-CM

## 2014-03-28 DIAGNOSIS — L03119 Cellulitis of unspecified part of limb: Secondary | ICD-10-CM

## 2014-03-28 DIAGNOSIS — Z22330 Carrier of Group B streptococcus: Secondary | ICD-10-CM

## 2014-03-28 DIAGNOSIS — O99892 Other specified diseases and conditions complicating childbirth: Secondary | ICD-10-CM | POA: Diagnosis present

## 2014-03-28 DIAGNOSIS — L02419 Cutaneous abscess of limb, unspecified: Secondary | ICD-10-CM | POA: Diagnosis present

## 2014-03-28 DIAGNOSIS — O139 Gestational [pregnancy-induced] hypertension without significant proteinuria, unspecified trimester: Principal | ICD-10-CM | POA: Diagnosis present

## 2014-03-28 DIAGNOSIS — O34219 Maternal care for unspecified type scar from previous cesarean delivery: Secondary | ICD-10-CM | POA: Diagnosis present

## 2014-03-28 DIAGNOSIS — O9989 Other specified diseases and conditions complicating pregnancy, childbirth and the puerperium: Secondary | ICD-10-CM

## 2014-03-28 DIAGNOSIS — O99214 Obesity complicating childbirth: Secondary | ICD-10-CM

## 2014-03-28 LAB — COMPREHENSIVE METABOLIC PANEL
ALK PHOS: 112 U/L (ref 39–117)
ALT: 11 U/L (ref 0–35)
AST: 17 U/L (ref 0–37)
Albumin: 2.7 g/dL — ABNORMAL LOW (ref 3.5–5.2)
BILIRUBIN TOTAL: 0.3 mg/dL (ref 0.3–1.2)
BUN: 6 mg/dL (ref 6–23)
CALCIUM: 9.5 mg/dL (ref 8.4–10.5)
CO2: 21 meq/L (ref 19–32)
Chloride: 101 mEq/L (ref 96–112)
Creatinine, Ser: 0.68 mg/dL (ref 0.50–1.10)
Glucose, Bld: 88 mg/dL (ref 70–99)
POTASSIUM: 4.4 meq/L (ref 3.7–5.3)
Sodium: 137 mEq/L (ref 137–147)
Total Protein: 6.6 g/dL (ref 6.0–8.3)

## 2014-03-28 LAB — CBC
HEMATOCRIT: 35.9 % — AB (ref 36.0–46.0)
HEMOGLOBIN: 11.5 g/dL — AB (ref 12.0–15.0)
MCH: 23.9 pg — ABNORMAL LOW (ref 26.0–34.0)
MCHC: 32 g/dL (ref 30.0–36.0)
MCV: 74.6 fL — AB (ref 78.0–100.0)
Platelets: 185 10*3/uL (ref 150–400)
RBC: 4.81 MIL/uL (ref 3.87–5.11)
RDW: 14.3 % (ref 11.5–15.5)
WBC: 11.3 10*3/uL — AB (ref 4.0–10.5)

## 2014-03-28 LAB — TYPE AND SCREEN
ABO/RH(D): A POS
Antibody Screen: NEGATIVE

## 2014-03-28 LAB — GLUCOSE, CAPILLARY: Glucose-Capillary: 83 mg/dL (ref 70–99)

## 2014-03-28 MED ORDER — LIDOCAINE HCL (PF) 1 % IJ SOLN: 30.0000 mL | INTRAMUSCULAR | Status: DC | PRN

## 2014-03-28 MED ORDER — LACTATED RINGERS IV SOLN: 500.0000 mL | INTRAVENOUS | Status: DC | PRN

## 2014-03-28 MED ORDER — OXYTOCIN BOLUS FROM INFUSION: 500.0000 mL | INTRAVENOUS | Status: DC

## 2014-03-28 MED ORDER — ZOLPIDEM TARTRATE 5 MG PO TABS
5.0000 mg | ORAL_TABLET | Freq: Every day | ORAL | Status: DC
  Administered 2014-03-28: 5 mg via ORAL
  Filled 2014-03-28: qty 1

## 2014-03-28 MED ORDER — LACTATED RINGERS IV SOLN: INTRAVENOUS | Status: DC

## 2014-03-28 MED ORDER — OXYTOCIN 40 UNITS IN LACTATED RINGERS INFUSION - SIMPLE MED
1.0000 m[IU]/min | INTRAVENOUS | Status: DC
  Administered 2014-03-28: 1 m[IU]/min via INTRAVENOUS
  Filled 2014-03-28: qty 1000

## 2014-03-28 MED ORDER — PENICILLIN G POTASSIUM 5000000 UNITS IJ SOLR
5.0000 10*6.[IU] | Freq: Once | INTRAVENOUS | Status: AC
  Administered 2014-03-28: 5 10*6.[IU] via INTRAVENOUS
  Filled 2014-03-28: qty 5

## 2014-03-28 MED ORDER — IBUPROFEN 600 MG PO TABS: 600.0000 mg | ORAL_TABLET | Freq: Four times a day (QID) | ORAL | Status: DC | PRN

## 2014-03-28 MED ORDER — FLEET ENEMA 7-19 GM/118ML RE ENEM: 1.0000 | ENEMA | RECTAL | Status: DC | PRN

## 2014-03-28 MED ORDER — OXYCODONE-ACETAMINOPHEN 5-325 MG PO TABS: 1.0000 | ORAL_TABLET | ORAL | Status: DC | PRN

## 2014-03-28 MED ORDER — OXYTOCIN 40 UNITS IN LACTATED RINGERS INFUSION - SIMPLE MED
62.5000 mL/h | INTRAVENOUS | Status: DC
Start: 2014-03-28 — End: 2014-03-29

## 2014-03-28 MED ORDER — ACETAMINOPHEN 325 MG PO TABS: 650.0000 mg | ORAL_TABLET | ORAL | Status: DC | PRN

## 2014-03-28 MED ORDER — TERBUTALINE SULFATE 1 MG/ML IJ SOLN: 0.2500 mg | Freq: Once | INTRAMUSCULAR | Status: AC | PRN

## 2014-03-28 MED ORDER — PENICILLIN G POTASSIUM 5000000 UNITS IJ SOLR
2.5000 10*6.[IU] | INTRAVENOUS | Status: DC
  Filled 2014-03-28 (×5): qty 2.5

## 2014-03-28 MED ORDER — CITRIC ACID-SODIUM CITRATE 334-500 MG/5ML PO SOLN
30.0000 mL | ORAL | Status: DC | PRN
  Administered 2014-03-29: 30 mL via ORAL
  Filled 2014-03-28: qty 15

## 2014-03-28 MED ORDER — ONDANSETRON HCL 4 MG/2ML IJ SOLN: 4.0000 mg | Freq: Four times a day (QID) | INTRAMUSCULAR | Status: DC | PRN

## 2014-03-28 MED ORDER — PENICILLIN G POTASSIUM 5000000 UNITS IJ SOLR
2.5000 10*6.[IU] | INTRAMUSCULAR | Status: DC
  Filled 2014-03-28 (×2): qty 2.5

## 2014-03-28 NOTE — H&P (Signed)
LABOR ADMISSION HISTORY AND PHYSICAL  Sara LimboShaterika D Keith is a 28 y.o. female G2P1001 with IUP at 2160w3d presenting for IOL for gHTN.  Pt has had elevated bp in 2-3 visits to the clinic over her pregnancy.  She also had an abnormal 3 hour on 6/15 and has only been checking sugars for the last week as an A1DM. She had a previous c/s x1 for failed IOL for IUGR- 3 day IOL with no change past 4cm.      [facility] [place] [facility] and then [facility]. Of note, also has a 2 vessel cord on sonogram.    Prenatal History/Complications:  Past Medical History: Past Medical History  Diagnosis Date  . Gestational diabetes   . Pregnancy induced hypertension     Past Surgical History: Past Surgical History  Procedure Laterality Date  . Sweat glan    . Cesarean section      Obstetrical History: OB History   Grav Para Term Preterm Abortions TAB SAB Ect Mult Living   2 1 1       1      G1- IOL for SGA and failed, primary c/s, 5lb 10oz   Social History: History   Social History  . Marital Status: Single    Spouse Name: N/A    Number of Children: N/A  . Years of Education: N/A   Social History Main Topics  . Smoking status: Former Games developermoker  . Smokeless tobacco: Never Used  . Alcohol Use: No  . Drug Use: No  . Sexual Activity: Yes    Birth Control/ Protection: None   Other Topics Concern  . Not on file   Social History Narrative  . No narrative on file    Family History: Family History  Problem Relation Age of Onset  . Kidney disease Mother   . Diabetes Father     Allergies: No Known Allergies  Prescriptions prior to admission  Medication Sig Dispense Refill  . ACCU-CHEK FASTCLIX LANCETS MISC Inject 1 each into the skin 4 (four) times daily. [phone] for testing 4 times daily  102 each  12  . acetaminophen (TYLENOL) 500 MG tablet Take 1,500-2,000 mg by mouth every 6 (six) hours as needed for headache.      Marland Kitchen. acetaminophen-codeine (TYLENOL #3) 300-30 MG  per tablet Take 1-2 tablets by mouth every 4 (four) hours as needed for moderate pain.  30 tablet  0  . cephALEXin (KEFLEX) 500 MG capsule Take 1 capsule (500 mg total) by mouth 4 (four) times daily.  28 capsule  2  . glucose blood test strip Use as instructed  100 each  12  . Prenatal Vit-Fe Fumarate-FA (MULTIVITAMIN-PRENATAL) 27-0.8 MG TABS tablet Take 1 tablet by mouth daily at 12 noon.      . Prenatal Vit-Min-FA-Fish Oil ([employer] PRENATAL GUMMY PO) Take 1 tablet by mouth 3 (three) times daily.         Review of Systems   All systems reviewed and negative except as stated in HPI  Blood pressure 144/75, pulse 103, temperature 98.7 F (37.1 C), temperature source Oral, resp. rate 18, height 5\' 6"  (1.676 m), weight 146.512 kg (323 lb), last menstrual period 07/02/2013. General appearance: alert, cooperative and no distress Lungs: clear to auscultation bilaterally Heart: regular rate and rhythm Abdomen: soft, non-tender; bowel sounds normal Extremities: Homans sign is negative, no sign of DVT DTR's 2+ Presentation: cephalic Fetal monitoringBaseline: 150 bpm, Variability: Good {> 6 bpm), Accelerations: Reactive  and Decelerations: Absent Uterine activity irritability    Dilation: Closed Effacement (%): 50 Station: -2 Exam by:: dr. Reola Calkinsbeck   Prenatal labs: ABO, Rh: A/Positive/-- (12/23 0000) Antibody: Negative (12/23 0000) Rubella:   RPR: Nonreactive (12/23 0000)  HBsAg: Negative (12/23 0000)  HIV: Non-reactive (12/23 0000)  GBS: Positive (06/15 0000)     Prenatal Transfer Tool  Maternal Diabetes: Yes:  Diabetes Type:  Diet controlled Genetic Screening: Declined Maternal Ultrasounds/Referrals: Abnormal:  Findings:   Other: 2 vessel cord Fetal Ultrasounds or other Referrals:  Fetal echo Maternal Substance Abuse:  No Significant Maternal Medications:  None Significant Maternal Lab Results: Lab values include: Group B Strep positive     No results found for this or any previous  visit (from the past 24 hour(s)).  Assessment: Sara Keith is a 28 y.o. G2P1001 at 3496w3d here for IOL for gHTN. Pt with a previous c/s x1.  Discussed TOLAC vs repeat c/s again with pt as she seemed to be wavering. Risks and benefits of both discussed. Pt elected at this time to proceed with TOLAC but states that she does not want to be induced for 3 days again.  She understands that there is a 1% chance of uterine rupture with TOLAC.     #Labor: cervix closed but effaced. Will start pit to see if it will open enough to place a FB. Pit 2x2. Unable to use cytotec #a1DM- cbg q4hr for now and q1 in active labor .  #Pain: Planning for epidural #FWB: Cat I tracing. EFW 7lb 6oz today 76%ile  #ID:  GBS pos- will start pcn #MOF: breast/bottle   [name] L 03/28/2014, 7:06 PM

## 2014-03-29 ENCOUNTER — Encounter (HOSPITAL_COMMUNITY): Payer: Self-pay | Admitting: Anesthesiology

## 2014-03-29 ENCOUNTER — Inpatient Hospital Stay (HOSPITAL_COMMUNITY): Payer: Medicaid Other | Admitting: Anesthesiology

## 2014-03-29 ENCOUNTER — Encounter (HOSPITAL_COMMUNITY): Payer: Medicaid Other | Admitting: Anesthesiology

## 2014-03-29 ENCOUNTER — Encounter (HOSPITAL_COMMUNITY)
Admission: AD | Disposition: A | Discharge status: Home / Self Care | Payer: Self-pay | Source: Ambulatory Visit | Attending: Obstetrics & Gynecology

## 2014-03-29 DIAGNOSIS — E669 Obesity, unspecified: Secondary | ICD-10-CM | POA: Diagnosis not present

## 2014-03-29 DIAGNOSIS — Z6841 Body Mass Index (BMI) 40.0 and over, adult: Secondary | ICD-10-CM | POA: Diagnosis not present

## 2014-03-29 DIAGNOSIS — O99210 Obesity complicating pregnancy, unspecified trimester: Secondary | ICD-10-CM

## 2014-03-29 DIAGNOSIS — O139 Gestational [pregnancy-induced] hypertension without significant proteinuria, unspecified trimester: Secondary | ICD-10-CM

## 2014-03-29 DIAGNOSIS — Z98891 History of uterine scar from previous surgery: Secondary | ICD-10-CM

## 2014-03-29 DIAGNOSIS — L02419 Cutaneous abscess of limb, unspecified: Secondary | ICD-10-CM

## 2014-03-29 DIAGNOSIS — L02415 Cutaneous abscess of right lower limb: Secondary | ICD-10-CM | POA: Diagnosis present

## 2014-03-29 DIAGNOSIS — O99214 Obesity complicating childbirth: Secondary | ICD-10-CM

## 2014-03-29 DIAGNOSIS — L03119 Cellulitis of unspecified part of limb: Secondary | ICD-10-CM

## 2014-03-29 LAB — CBC
HCT: 33.7 % — ABNORMAL LOW (ref 36.0–46.0)
HEMOGLOBIN: 10.7 g/dL — AB (ref 12.0–15.0)
MCH: 23.7 pg — ABNORMAL LOW (ref 26.0–34.0)
MCHC: 31.8 g/dL (ref 30.0–36.0)
MCV: 74.7 fL — ABNORMAL LOW (ref 78.0–100.0)
Platelets: 195 10*3/uL (ref 150–400)
RBC: 4.51 MIL/uL (ref 3.87–5.11)
RDW: 14.2 % (ref 11.5–15.5)
WBC: 10.7 10*3/uL — AB (ref 4.0–10.5)

## 2014-03-29 LAB — GLUCOSE, CAPILLARY
Glucose-Capillary: 104 mg/dL — ABNORMAL HIGH (ref 70–99)
Glucose-Capillary: 92 mg/dL (ref 70–99)

## 2014-03-29 LAB — PROTEIN / CREATININE RATIO, URINE
Creatinine, Urine: 207.88 mg/dL
Protein Creatinine Ratio: 0.1 (ref 0.00–0.15)
Total Protein, Urine: 21.2 mg/dL

## 2014-03-29 LAB — ABO/RH: ABO/RH(D): A POS

## 2014-03-29 LAB — RPR

## 2014-03-29 SURGERY — Surgical Case
Anesthesia: Spinal

## 2014-03-29 MED ORDER — PHENYLEPHRINE HCL 10 MG/ML IJ SOLN
INTRAMUSCULAR | Status: AC
  Filled 2014-03-29: qty 1

## 2014-03-29 MED ORDER — MEPERIDINE HCL 25 MG/ML IJ SOLN: 6.2500 mg | INTRAMUSCULAR | Status: DC | PRN

## 2014-03-29 MED ORDER — PHENYLEPHRINE 8 MG IN D5W 100 ML (0.08MG/ML) PREMIX OPTIME
INJECTION | INTRAVENOUS | Status: AC
  Filled 2014-03-29: qty 100

## 2014-03-29 MED ORDER — NALOXONE HCL 1 MG/ML IJ SOLN
1.0000 ug/kg/h | INTRAVENOUS | Status: DC | PRN
  Filled 2014-03-29: qty 2

## 2014-03-29 MED ORDER — OXYCODONE-ACETAMINOPHEN 5-325 MG PO TABS
1.0000 | ORAL_TABLET | ORAL | Status: DC | PRN
  Administered 2014-03-30: 1 via ORAL
  Administered 2014-03-31: 2 via ORAL
  Administered 2014-03-31: 1 via ORAL
  Administered 2014-03-31 – 2014-04-01 (×3): 2 via ORAL
  Filled 2014-03-29 (×2): qty 2
  Filled 2014-03-29 (×2): qty 1
  Filled 2014-03-29 (×2): qty 2

## 2014-03-29 MED ORDER — KETOROLAC TROMETHAMINE 30 MG/ML IJ SOLN: 30.0000 mg | Freq: Four times a day (QID) | INTRAMUSCULAR | Status: AC | PRN

## 2014-03-29 MED ORDER — SIMETHICONE 80 MG PO CHEW
80.0000 mg | CHEWABLE_TABLET | ORAL | Status: DC
  Administered 2014-03-30 – 2014-03-31 (×2): 80 mg via ORAL
  Filled 2014-03-29 (×2): qty 1

## 2014-03-29 MED ORDER — MORPHINE SULFATE (PF) 0.5 MG/ML IJ SOLN
INTRAMUSCULAR | Status: DC | PRN
  Administered 2014-03-29: .2 mg via INTRATHECAL

## 2014-03-29 MED ORDER — IBUPROFEN 600 MG PO TABS: 600.0000 mg | ORAL_TABLET | Freq: Four times a day (QID) | ORAL | Status: DC | PRN

## 2014-03-29 MED ORDER — NALOXONE HCL 0.4 MG/ML IJ SOLN: 0.4000 mg | INTRAMUSCULAR | Status: DC | PRN

## 2014-03-29 MED ORDER — ONDANSETRON HCL 4 MG/2ML IJ SOLN: 4.0000 mg | INTRAMUSCULAR | Status: DC | PRN

## 2014-03-29 MED ORDER — SCOPOLAMINE 1 MG/3DAYS TD PT72
MEDICATED_PATCH | TRANSDERMAL | Status: AC
  Filled 2014-03-29: qty 1

## 2014-03-29 MED ORDER — DEXTROSE 5 % IV SOLN
2.0000 g | Freq: Once | INTRAVENOUS | Status: AC
  Administered 2014-03-29 (×2): 2 g via INTRAVENOUS
  Filled 2014-03-29: qty 2

## 2014-03-29 MED ORDER — ONDANSETRON HCL 4 MG/2ML IJ SOLN: 4.0000 mg | Freq: Three times a day (TID) | INTRAMUSCULAR | Status: DC | PRN

## 2014-03-29 MED ORDER — BUPIVACAINE HCL (PF) 0.5 % IJ SOLN
INTRAMUSCULAR | Status: AC
  Filled 2014-03-29: qty 30

## 2014-03-29 MED ORDER — DIPHENHYDRAMINE HCL 50 MG/ML IJ SOLN: 25.0000 mg | INTRAMUSCULAR | Status: DC | PRN

## 2014-03-29 MED ORDER — SULFAMETHOXAZOLE-TMP DS 800-160 MG PO TABS
1.0000 | ORAL_TABLET | Freq: Two times a day (BID) | ORAL | Status: DC
  Administered 2014-03-30 – 2014-04-01 (×5): 1 via ORAL
  Filled 2014-03-29 (×6): qty 1

## 2014-03-29 MED ORDER — DIPHENHYDRAMINE HCL 25 MG PO CAPS: 25.0000 mg | ORAL_CAPSULE | Freq: Four times a day (QID) | ORAL | Status: DC | PRN

## 2014-03-29 MED ORDER — DIBUCAINE 1 % RE OINT: 1.0000 "application " | TOPICAL_OINTMENT | RECTAL | Status: DC | PRN

## 2014-03-29 MED ORDER — MAGNESIUM HYDROXIDE 400 MG/5ML PO SUSP: 30.0000 mL | ORAL | Status: DC | PRN

## 2014-03-29 MED ORDER — MEASLES, MUMPS & RUBELLA VAC ~~LOC~~ INJ
0.5000 mL | INJECTION | Freq: Once | SUBCUTANEOUS | Status: DC
  Filled 2014-03-29: qty 0.5

## 2014-03-29 MED ORDER — LANOLIN HYDROUS EX OINT: 1.0000 "application " | TOPICAL_OINTMENT | CUTANEOUS | Status: DC | PRN

## 2014-03-29 MED ORDER — KETOROLAC TROMETHAMINE 30 MG/ML IJ SOLN
INTRAMUSCULAR | Status: AC
  Administered 2014-03-29: 30 mg via INTRAVENOUS
  Filled 2014-03-29: qty 1

## 2014-03-29 MED ORDER — ONDANSETRON HCL 4 MG PO TABS: 4.0000 mg | ORAL_TABLET | ORAL | Status: DC | PRN

## 2014-03-29 MED ORDER — ONDANSETRON HCL 4 MG/2ML IJ SOLN
INTRAMUSCULAR | Status: AC
  Filled 2014-03-29: qty 2

## 2014-03-29 MED ORDER — FENTANYL CITRATE 0.05 MG/ML IJ SOLN
INTRAMUSCULAR | Status: AC
  Filled 2014-03-29: qty 2

## 2014-03-29 MED ORDER — SENNOSIDES-DOCUSATE SODIUM 8.6-50 MG PO TABS
2.0000 | ORAL_TABLET | ORAL | Status: DC
  Administered 2014-03-30 – 2014-03-31 (×3): 2 via ORAL
  Filled 2014-03-29 (×3): qty 2

## 2014-03-29 MED ORDER — SODIUM CHLORIDE 0.9 % IJ SOLN: 3.0000 mL | INTRAMUSCULAR | Status: DC | PRN

## 2014-03-29 MED ORDER — HYDROMORPHONE HCL PF 1 MG/ML IJ SOLN
0.2500 mg | INTRAMUSCULAR | Status: DC | PRN
Start: 2014-03-29 — End: 2014-03-29

## 2014-03-29 MED ORDER — MORPHINE SULFATE 0.5 MG/ML IJ SOLN
INTRAMUSCULAR | Status: AC
  Filled 2014-03-29: qty 10

## 2014-03-29 MED ORDER — PROMETHAZINE HCL 25 MG/ML IJ SOLN: 6.2500 mg | INTRAMUSCULAR | Status: DC | PRN

## 2014-03-29 MED ORDER — IBUPROFEN 600 MG PO TABS
600.0000 mg | ORAL_TABLET | Freq: Four times a day (QID) | ORAL | Status: DC
  Administered 2014-03-30 – 2014-04-01 (×10): 600 mg via ORAL
  Filled 2014-03-29 (×11): qty 1

## 2014-03-29 MED ORDER — TETANUS-DIPHTH-ACELL PERTUSSIS 5-2.5-18.5 LF-MCG/0.5 IM SUSP
0.5000 mL | Freq: Once | INTRAMUSCULAR | Status: DC
  Filled 2014-03-29: qty 0.5

## 2014-03-29 MED ORDER — DIPHENHYDRAMINE HCL 25 MG PO CAPS: 25.0000 mg | ORAL_CAPSULE | ORAL | Status: DC | PRN

## 2014-03-29 MED ORDER — NALBUPHINE HCL 10 MG/ML IJ SOLN: 5.0000 mg | INTRAMUSCULAR | Status: DC | PRN

## 2014-03-29 MED ORDER — OXYTOCIN 10 UNIT/ML IJ SOLN
INTRAMUSCULAR | Status: AC
  Filled 2014-03-29: qty 4

## 2014-03-29 MED ORDER — KETOROLAC TROMETHAMINE 30 MG/ML IJ SOLN: 15.0000 mg | Freq: Once | INTRAMUSCULAR | Status: DC | PRN

## 2014-03-29 MED ORDER — METOCLOPRAMIDE HCL 5 MG/ML IJ SOLN
10.0000 mg | Freq: Three times a day (TID) | INTRAMUSCULAR | Status: DC | PRN
  Administered 2014-03-29: 10 mg via INTRAVENOUS
  Filled 2014-03-29: qty 2

## 2014-03-29 MED ORDER — PRENATAL MULTIVITAMIN CH
1.0000 | ORAL_TABLET | Freq: Every day | ORAL | Status: DC
  Administered 2014-03-30 – 2014-03-31 (×2): 1 via ORAL
  Filled 2014-03-29 (×2): qty 1

## 2014-03-29 MED ORDER — LACTATED RINGERS IV SOLN
INTRAVENOUS | Status: DC | PRN
  Administered 2014-03-29: 14:00:00 via INTRAVENOUS

## 2014-03-29 MED ORDER — SCOPOLAMINE 1 MG/3DAYS TD PT72: 1.0000 | MEDICATED_PATCH | Freq: Once | TRANSDERMAL | Status: DC

## 2014-03-29 MED ORDER — DIPHENHYDRAMINE HCL 50 MG/ML IJ SOLN: 12.5000 mg | INTRAMUSCULAR | Status: DC | PRN

## 2014-03-29 MED ORDER — PHENYLEPHRINE 8 MG IN D5W 100 ML (0.08MG/ML) PREMIX OPTIME
INJECTION | INTRAVENOUS | Status: DC | PRN
  Administered 2014-03-29: 60 ug/min via INTRAVENOUS

## 2014-03-29 MED ORDER — BUPIVACAINE HCL (PF) 0.5 % IJ SOLN
INTRAMUSCULAR | Status: DC | PRN
  Administered 2014-03-29: 30 mL

## 2014-03-29 MED ORDER — ONDANSETRON HCL 4 MG/2ML IJ SOLN
INTRAMUSCULAR | Status: DC | PRN
  Administered 2014-03-29: 4 mg via INTRAVENOUS

## 2014-03-29 MED ORDER — OXYTOCIN 10 UNIT/ML IJ SOLN
INTRAMUSCULAR | Status: AC
Start: 2014-03-29 — End: 2014-03-29
  Filled 2014-03-29: qty 4

## 2014-03-29 MED ORDER — SIMETHICONE 80 MG PO CHEW
80.0000 mg | CHEWABLE_TABLET | ORAL | Status: DC | PRN
  Administered 2014-03-30: 80 mg via ORAL
  Filled 2014-03-29: qty 1

## 2014-03-29 MED ORDER — FENTANYL CITRATE 0.05 MG/ML IJ SOLN: 100.0000 ug | INTRAMUSCULAR | Status: DC | PRN

## 2014-03-29 MED ORDER — KETOROLAC TROMETHAMINE 30 MG/ML IJ SOLN
30.0000 mg | Freq: Four times a day (QID) | INTRAMUSCULAR | Status: AC | PRN
  Administered 2014-03-29: 30 mg via INTRAVENOUS

## 2014-03-29 MED ORDER — OXYTOCIN 40 UNITS IN LACTATED RINGERS INFUSION - SIMPLE MED: 62.5000 mL/h | INTRAVENOUS | Status: AC

## 2014-03-29 MED ORDER — OXYTOCIN 10 UNIT/ML IJ SOLN
40.0000 [IU] | INTRAVENOUS | Status: DC | PRN
  Administered 2014-03-29: 40 [IU] via INTRAVENOUS

## 2014-03-29 MED ORDER — BUPIVACAINE IN DEXTROSE 0.75-8.25 % IT SOLN
INTRATHECAL | Status: DC | PRN
  Administered 2014-03-29: 1.4 mL via INTRATHECAL

## 2014-03-29 MED ORDER — LACTATED RINGERS IV SOLN
INTRAVENOUS | Status: DC
  Administered 2014-03-29 – 2014-03-30 (×3): via INTRAVENOUS

## 2014-03-29 MED ORDER — ZOLPIDEM TARTRATE 5 MG PO TABS: 5.0000 mg | ORAL_TABLET | Freq: Every evening | ORAL | Status: DC | PRN

## 2014-03-29 MED ORDER — LACTATED RINGERS IV SOLN
INTRAVENOUS | Status: DC | PRN
  Administered 2014-03-29 (×4): via INTRAVENOUS

## 2014-03-29 MED ORDER — MENTHOL 3 MG MT LOZG: 1.0000 | LOZENGE | OROMUCOSAL | Status: DC | PRN

## 2014-03-29 MED ORDER — WITCH HAZEL-GLYCERIN EX PADS: 1.0000 "application " | MEDICATED_PAD | CUTANEOUS | Status: DC | PRN

## 2014-03-29 MED ORDER — FENTANYL CITRATE 0.05 MG/ML IJ SOLN
INTRAMUSCULAR | Status: DC | PRN
  Administered 2014-03-29: 12.5 ug via INTRATHECAL

## 2014-03-29 MED ORDER — PHENYLEPHRINE 40 MCG/ML (10ML) SYRINGE FOR IV PUSH (FOR BLOOD PRESSURE SUPPORT)
PREFILLED_SYRINGE | INTRAVENOUS | Status: AC
  Filled 2014-03-29: qty 5

## 2014-03-29 SURGICAL SUPPLY — 37 items
BLADE SURG 10 STRL SS (BLADE) IMPLANT
CLAMP CORD UMBIL (MISCELLANEOUS) IMPLANT
CLOTH BEACON ORANGE TIMEOUT ST (SAFETY) ×3 IMPLANT
DRAPE LG THREE QUARTER DISP (DRAPES) IMPLANT
DRESSING DISP NPWT PICO 4X12 (MISCELLANEOUS) ×2 IMPLANT
DRSG OPSITE POSTOP 4X10 (GAUZE/BANDAGES/DRESSINGS) ×3 IMPLANT
DURAPREP 26ML APPLICATOR (WOUND CARE) ×3 IMPLANT
ELECT REM PT RETURN 9FT ADLT (ELECTROSURGICAL) ×3
ELECTRODE REM PT RTRN 9FT ADLT (ELECTROSURGICAL) ×1 IMPLANT
EXTRACTOR VACUUM M CUP 4 TUBE (SUCTIONS) IMPLANT
EXTRACTOR VACUUM M CUP 4' TUBE (SUCTIONS)
GLOVE BIOGEL PI IND STRL 7.0 (GLOVE) ×1 IMPLANT
GLOVE BIOGEL PI INDICATOR 7.0 (GLOVE) ×2
GLOVE ECLIPSE 7.0 STRL STRAW (GLOVE) ×3 IMPLANT
GOWN STRL REUS W/TWL LRG LVL3 (GOWN DISPOSABLE) ×6 IMPLANT
KIT ABG SYR 3ML LUER SLIP (SYRINGE) IMPLANT
NDL HYPO 25X5/8 SAFETYGLIDE (NEEDLE) ×1 IMPLANT
NEEDLE HYPO 22GX1.5 SAFETY (NEEDLE) ×3 IMPLANT
NEEDLE HYPO 25X5/8 SAFETYGLIDE (NEEDLE) ×3 IMPLANT
NS IRRIG 1000ML POUR BTL (IV SOLUTION) ×3 IMPLANT
PACK C SECTION WH (CUSTOM PROCEDURE TRAY) ×3 IMPLANT
PAD ABD 7.5X8 STRL (GAUZE/BANDAGES/DRESSINGS) ×3 IMPLANT
PAD ABD 8X7 1/2 STERILE (GAUZE/BANDAGES/DRESSINGS) ×2 IMPLANT
PAD OB MATERNITY 4.3X12.25 (PERSONAL CARE ITEMS) ×3 IMPLANT
RTRCTR C-SECT PINK 25CM LRG (MISCELLANEOUS) IMPLANT
STAPLER VISISTAT 35W (STAPLE) IMPLANT
SUT PDS AB 0 CT1 27 (SUTURE) IMPLANT
SUT PDS AB 0 CTX 36 PDP370T (SUTURE) ×2 IMPLANT
SUT PLAIN 2 0 XLH (SUTURE) ×2 IMPLANT
SUT VIC AB 0 CT1 36 (SUTURE) ×6 IMPLANT
SUT VIC AB 0 CTX 36 (SUTURE) ×6
SUT VIC AB 0 CTX36XBRD ANBCTRL (SUTURE) ×2 IMPLANT
SUT VIC AB 4-0 KS 27 (SUTURE) ×3 IMPLANT
SYR 30ML LL (SYRINGE) ×3 IMPLANT
TOWEL OR 17X24 6PK STRL BLUE (TOWEL DISPOSABLE) ×3 IMPLANT
TRAY FOLEY CATH 14FR (SET/KITS/TRAYS/PACK) ×3 IMPLANT
WATER STERILE IRR 1000ML POUR (IV SOLUTION) ×3 IMPLANT

## 2014-03-29 NOTE — Progress Notes (Signed)
125cc urine foley output in 5 1/2 hours.  Amt called to MD. Orders to call MD with output in 2 hours.

## 2014-03-29 NOTE — Transfer of Care (Signed)
Immediate Anesthesia Transfer of Care Note  Patient: Sara Keith  Procedure(s) Performed: Procedure(s): CESAREAN SECTION (N/A)  Patient Location: PACU  Anesthesia Type:Spinal  Level of Consciousness: awake, alert  and oriented  Airway & Oxygen Therapy: Patient Spontanous Breathing  Post-op Assessment: Report given to PACU RN and Post -op Vital signs reviewed and stable  Post vital signs: Reviewed and stable  Complications: No apparent anesthesia complications

## 2014-03-29 NOTE — Op Note (Signed)
Sara Keith PROCEDURE DATE: 03/29/2014  PREOPERATIVE DIAGNOSES: Intrauterine pregnancy at 6446w4d weeks gestation; gestational hypertension; declines TOLAC and desires elective repeat cesarean delivery; recurrent right inner thigh abscess; morbid obesity with preoperative BMI of 52.2  POSTOPERATIVE DIAGNOSES: The same  PROCEDURE: Repeat Low Transverse Cesarean Section; Incision and Drainage of Right Inner Thigh Abscess  SURGEON:  Dr. Jaynie CollinsUgonna Anyanwu  ANESTHESIOLOGIST: Dr. Leilani AbleFranklin Hatchett  INDICATIONS: Sara Keith is a 28 y.o. [phone] at 5746w4d here for cesarean section secondary to the indications listed under preoperative diagnoses; please see preoperative note for further details.  The risks of cesarean section were discussed with the patient including but were not limited to: bleeding which may require transfusion or reoperation; infection which may require antibiotics; injury to bowel, bladder, ureters or other surrounding organs; injury to the fetus; need for additional procedures including hysterectomy in the event of a life-threatening hemorrhage; placental abnormalities wth subsequent pregnancies, incisional problems, thromboembolic phenomenon and other postoperative/anesthesia complications.   The patient concurred with the proposed plan, giving informed written consent for the procedure.  Of note, in the operating room, it was noted that patient's right inner thigh abscess had recurred. On exam, 3 cm fluctuant mass observed with mild erythema. Patient also consented for repeat incision and drainage to be performed at the end of the cesarean section.   FINDINGS:  Viable female infant in cephalic presentation.  Apgars 9 and 9.  Clear amniotic fluid.  Intact placenta, three vessel cord.  Normal uterus, fallopian tubes and ovaries bilaterally. Minimal adhesive disease noted.  3 cm fluctuant mass observed with mild erythema in right inner thigh.  ANESTHESIA: Spinal INTRAVENOUS FLUIDS:  3400 ml ESTIMATED BLOOD LOSS: 1000 ml URINE OUTPUT:  100 ml SPECIMENS: Placenta sent to L&D COMPLICATIONS: None immediate  PROCEDURE IN DETAIL:  The patient preoperatively received intravenous antibiotics and had sequential compression devices applied to her lower extremities.  She was then taken to the operating room where spinal anesthesia was administered and was found to be adequate. She was then placed in a dorsal supine position with a leftward tilt, and prepped and draped in a sterile manner.  A foley catheter was placed into her bladder and attached to constant gravity.  After an adequate timeout was performed, a Pfannenstiel skin incision was made with scalpel over her preexisting scar and carried through to the underlying layer of fascia. The fascia was incised in the midline, and this incision was extended bilaterally using the Mayo scissors.  Kocher clamps were applied to the superior aspect of the fascial incision and the underlying rectus muscles were dissected off bluntly. A similar process was carried out on the inferior aspect of the fascial incision. The rectus muscles were separated in the midline bluntly and the peritoneum was entered bluntly. Minimal adhesive disease noted.  Attention was turned to the lower uterine segment where a low transverse hysterotomy was made with a scalpel and extended bilaterally bluntly.  The infant was successfully delivered, the cord was clamped and cut and the infant was handed over to awaiting neonatology team. Uterine massage was then administered, and the placenta delivered intact with a three-vessel cord. The uterus was then cleared of clot and debris.  The hysterotomy was closed with 0 Vicryl in a running locked fashion, and an imbricating layer was also placed with 0 Vicryl. The pelvis was cleared of all clot and debris. Hemostasis was confirmed on all surfaces.  The peritoneum was reapproximated using a 0 Vicryl running stitches. The fascia  was then  closed using 0 PDS in a running fashion.  The subcutaneous layer was irrigated, then reapproximated with 2-0 plain gut interrupted stitches, and 27 ml of 0.5% Marcaine was injected subcutaneously around the incision.  The skin was closed with a 4-0 Vicryl subcuticular stitch.  A PICO disposable negative pressure wound therapy device was placed over the incision.  The suction was activated at a pressure of 80 mmHg.  The adhesive was affixed well and there were no leaks noted.  Attention was then turned to her inner right thigh.  The area surrounding the abscess was prepped with betadine.  A 7-mm incision was made with the scalpel and there was immediate return of copious amount of purulent fluid.  The abscess was explored with a hemostat and more purulent material was expressed. 3 ml of 0.5% Marcaine was injected subcutaneously around the small incision.  No packing was done, area covered with an ABD pad and loose tape.  She will be treated with a course of Bactrim DS twice daily for 10 days.  The patient tolerated the procedures well. Sponge, lap, instrument and needle counts were correct x 2.  She was taken to the recovery room in stable condition.    Jaynie CollinsUGONNA  ANYANWU, MD, FACOG Attending Obstetrician & Gynecologist Faculty Practice, [facility]

## 2014-03-29 NOTE — Progress Notes (Signed)
Patient ID: Sara Keith, female   DOB: 12/23/1985, 28 y.o.   MRN: [id number]  CTSP for pt's questions about pain and plan of care. She is desirous of a repeat C/S due to not being able to handle the abd/vag pressure she is experiencing with the foley in place. We rev'd pain med options, including epidural, and she elects the repeat section.  FHR 130s +accels, no decels, no ctx per toco. Foley bulb removed. Pt last ate at 2230 and is to remain NPO until her surgery, which the timing of is TBD.  Cam HaiSHAW, KIMBERLY 03/29/2014 2:12 AM

## 2014-03-29 NOTE — Progress Notes (Signed)
Pt c/o cramping and very painful UC. States she is not wanting to go forward with trial of labor and desires a repeat C/S. Notified K. Clelia CroftShaw, CNM - will come and talk to patient.

## 2014-03-29 NOTE — Progress Notes (Signed)
Sara Keith is a 28 y.o. G2P1001 at 7479w4d admitted for induction of labor due to Gestational Hypertension. Also has A1GDM. She had a previous c/s x1 for failed IOL for IUGR- 3 day IOL with no change past 4cm.   Subjective: Patient declines TOLAC, now wants elective RTLCS.  IOL stopped around 0200; she has been NPO since 2230 last night. Reports occasional contractions, no other concerns.  Objective: BP 141/79  Pulse 87  Temp(Src) 98.5 F (36.9 C) (Oral)  Resp 18  Ht 5\' 6"  (1.676 m)  Wt 323 lb (146.512 kg)  BMI 52.16 kg/m2  LMP 07/02/2013  Abd: Obese, well-healed Pfannenstiel incision FHT:  FHR: 130 bpm, variability: moderate,  accelerations:  Present,  decelerations:  Absent UC:   irregular SVE:   Dilation: Fingertip Effacement (%): 80 Station: -2 Exam by:: Dr. Despina HiddenEure  Labs: Lab Results  Component Value Date   WBC 10.7* 03/29/2014   HGB 10.7* 03/29/2014   HCT 33.7* 03/29/2014   MCV 74.7* 03/29/2014   PLT 195 03/29/2014    Assessment / Plan: Patient desires ERLTCS, declines TOLAC.  [facility]  The risks of repeat cesarean section discussed with the patient included but were not limited to: bleeding which may require transfusion or reoperation; infection which may require antibiotics; injury to bowel, bladder, ureters or other surrounding organs; injury to the fetus; need for additional procedures including hysterectomy in the event of a life-threatening hemorrhage; placental abnormalities wth subsequent pregnancies; need for repeat cesarean sections for subsequent deliveries; incisional problems, thromboembolic phenomenon and other postoperative/anesthesia complications. The patient concurred with the proposed plan, giving informed written consent for the procedure.   Patient has been NPO since 2230 she will remain NPO for procedure. Anesthesia and OR aware. Preoperative prophylactic antibiotics and SCDs ordered on call to the OR.  Patient will need PICO wound  dressing after procedure. To OR when ready.    Tereso NewcomerANYANWU,Trinidad Ingle A, MD 03/29/2014, 9:17 AM

## 2014-03-29 NOTE — Anesthesia Procedure Notes (Signed)
Spinal  Patient location during procedure: OR Start time: 03/29/2014 12:46 PM End time: 03/29/2014 1:07 PM Staffing Anesthesiologist: Leilani AbleHATCHETT, FRANKLIN Performed by: anesthesiologist  Preanesthetic Checklist Completed: patient identified, surgical consent, pre-op evaluation, timeout performed, IV checked, risks and benefits discussed and monitors and equipment checked Spinal Block Patient position: sitting Prep: DuraPrep Patient monitoring: heart rate, cardiac monitor, continuous pulse ox and blood pressure Approach: midline Location: L3-4 Injection technique: single-shot Needle Needle type: Sprotte  Needle gauge: 24 G Needle length: 12.7 cm Needle insertion depth: 12 cm Assessment Sensory level: T4 Additional Notes Needed 18G Tuohy to 9cm followed by 24G Sprotte to hub in order to obtain CSF.

## 2014-03-29 NOTE — Progress Notes (Signed)
MD updated on pt status

## 2014-03-29 NOTE — OR Nursing (Signed)
Refused scope patch-

## 2014-03-29 NOTE — Anesthesia Preprocedure Evaluation (Signed)
Anesthesia Evaluation  Patient identified by MRN, date of birth, ID band Patient awake    Reviewed: Allergy & Precautions, H&P , NPO status , Patient's Chart, lab work & pertinent test results  Airway Mallampati: III TM Distance: >3 FB Neck ROM: full    Dental no notable dental hx.    Pulmonary neg pulmonary ROS, former smoker,    Pulmonary exam normal       Cardiovascular hypertension,     Neuro/Psych negative neurological ROS  negative psych ROS   GI/Hepatic negative GI ROS, Neg liver ROS,   Endo/Other  diabetesMorbid obesity  Renal/GU negative Renal ROS     Musculoskeletal   Abdominal (+) + obese,   Peds  Hematology negative hematology ROS (+)   Anesthesia Other Findings   Reproductive/Obstetrics (+) Pregnancy                           Anesthesia Physical Anesthesia Plan  ASA: III  Anesthesia Plan: Spinal   Post-op Pain Management:    Induction:   Airway Management Planned:   Additional Equipment:   Intra-op Plan:   Post-operative Plan:   Informed Consent: I have reviewed the patients History and Physical, chart, labs and discussed the procedure including the risks, benefits and alternatives for the proposed anesthesia with the patient or authorized representative who has indicated his/her understanding and acceptance.     Plan Discussed with: CRNA and Surgeon  Anesthesia Plan Comments:         Anesthesia Quick Evaluation

## 2014-03-29 NOTE — Anesthesia Postprocedure Evaluation (Signed)
Anesthesia Post Note  Patient: Sara LimboShaterika D Keith  Procedure(s) Performed: Procedure(s) (LRB): CESAREAN SECTION (N/A)  Anesthesia type: Spinal  Patient location: PACU  Post pain: Pain level controlled  Post assessment: Post-op Vital signs reviewed  Last Vitals:  Filed Vitals:   03/29/14 1515  BP: 139/67  Pulse: 96  Temp:   Resp: 18    Post vital signs: Reviewed  Level of consciousness: awake  Complications: No apparent anesthesia complications

## 2014-03-29 NOTE — Progress Notes (Signed)
Faculty Practice OB/GYN Attending Note  On evaluation in the OR, it was noted that patient's R inner thigh abscess has recurred. This was drained in the office on 03/17/14.  On exam, fluctuant mass observed with mild erythema. Patient consented for repeat incision and drainage, this will be performed at the end of the cesarean section. OR staff aware, written consent modified and initialed by patient.  Will give Bactrim DS bid x 10 days after surgery for infection treatment, and continue close observation.  Jaynie CollinsUGONNA  ANYANWU, MD, FACOG Attending Obstetrician & Gynecologist Faculty Practice, [facility]

## 2014-03-30 LAB — CBC
HCT: 31 % — ABNORMAL LOW (ref 36.0–46.0)
Hemoglobin: 9.7 g/dL — ABNORMAL LOW (ref 12.0–15.0)
MCH: 23.4 pg — ABNORMAL LOW (ref 26.0–34.0)
MCHC: 31.3 g/dL (ref 30.0–36.0)
MCV: 74.7 fL — AB (ref 78.0–100.0)
PLATELETS: 144 10*3/uL — AB (ref 150–400)
RBC: 4.15 MIL/uL (ref 3.87–5.11)
RDW: 14.1 % (ref 11.5–15.5)
WBC: 11.1 10*3/uL — ABNORMAL HIGH (ref 4.0–10.5)

## 2014-03-30 LAB — GLUCOSE, CAPILLARY: Glucose-Capillary: 106 mg/dL — ABNORMAL HIGH (ref 70–99)

## 2014-03-30 MED ORDER — LACTATED RINGERS IV BOLUS (SEPSIS)
500.0000 mL | Freq: Once | INTRAVENOUS | Status: AC
  Administered 2014-03-30: 500 mL via INTRAVENOUS

## 2014-03-30 MED ORDER — MEASLES, MUMPS & RUBELLA VAC ~~LOC~~ INJ
0.5000 mL | INJECTION | Freq: Once | SUBCUTANEOUS | Status: DC
  Filled 2014-03-30: qty 0.5

## 2014-03-30 NOTE — Anesthesia Postprocedure Evaluation (Signed)
Anesthesia Post Note  Patient: Sara Keith  Procedure(s) Performed: Procedure(s) (LRB): CESAREAN SECTION (N/A)  Anesthesia type: Spinal  Patient location: Mother/Baby  Post pain: Pain level controlled  Post assessment: Post-op Vital signs reviewed  Last Vitals:  Filed Vitals:   03/30/14 0545  BP: 101/62  Pulse: 73  Temp:   Resp: 26    Post vital signs: Reviewed  Level of consciousness: awake  Complications: No apparent anesthesia complications

## 2014-03-30 NOTE — Progress Notes (Addendum)
Subjective: Postpartum Day 1: RLTCesarean Delivery Drowsy, ate some salting crackers @ 0600 w/o vomiting, drinking po fluids, stood by bed. Still has foley.  Lochia and pain wnl.  Denies dizziness, lightheadedness, or sob. No complaints.   Objective: Vital signs in last 24 hours: Temp:  [97.4 F (36.3 C)-98.6 F (37 C)] 98.1 F (36.7 C) (06/28 0515) Pulse Rate:  [73-105] 73 (06/28 0545) Resp:  [14-26] 26 (06/28 0545) BP: (91-146)/(45-91) 101/62 mmHg (06/28 0545) SpO2:  [96 %-100 %] 97 % (06/28 0545)  Physical Exam:  General: drowsy, cooperative, no distress Lochia: appropriate Uterine Fundus: firm Incision: healing well, no significant drainage, no dehiscence, no significant erythema DVT Evaluation: No evidence of DVT seen on physical exam. Negative Homan's sign. No cords or calf tenderness. No significant calf/ankle edema. PICO wound vac functioning appropriately Area on Rt upper thigh where boil drained- dressing intact w/ some lochia   Recent Labs  03/28/14 1955 03/29/14 0710  HGB 11.5* 10.7*  HCT 35.9* 33.7*    Assessment/Plan: Status post Cesarean section. Had some decreased urine output overnight, night RN states she emptied 150ml from 0130-0530 although not documented, so just over 3330ml/hr. Has ~100cc in bag now, concentrated.  RN states she got up to br ok earlier, but not taking po's very well. Will give 500ml LR bolus, can take foley out if pt getting up w/o difficulty. Continue to monitor uo via nun's cap if foley removed.  Today's cbc has not yet been drawn.  Breastfeeding Plans depo for contraception  Marge DuncansBooker, Kimberly Randall 03/30/2014, 7:19 AM

## 2014-03-30 NOTE — Addendum Note (Signed)
Addendum created 03/30/14 0934 by Graciela HusbandsWynn O Fussell, CRNA   Modules edited: Notes Section   Notes Section:  File: [id number]

## 2014-03-30 NOTE — Progress Notes (Signed)
DR. Lyn HollingsheadALEXANDER  Phoned as requested and notified of pt urine out put 52 cc's since 2330 pm. Dr . Ivan Anchorsequested that " foley be left in till urine out put picks"  Hopefully by AM.

## 2014-03-31 ENCOUNTER — Encounter (HOSPITAL_COMMUNITY): Payer: Self-pay | Admitting: Obstetrics & Gynecology

## 2014-03-31 ENCOUNTER — Other Ambulatory Visit: Payer: Medicaid Other

## 2014-03-31 NOTE — Progress Notes (Signed)
Subjective: Postpartum Day 2: Cesarean Delivery Patient reports tolerating PO and no problems voiding.  Pain well controlled with meds.  Decreased bleeding.  +bottlefeeding.  No report of calf pain.    Objective: Vital signs in last 24 hours: Temp:  [97.6 F (36.4 C)-98.2 F (36.8 C)] 98.2 F (36.8 C) (06/29 0600) Pulse Rate:  [79-103] 88 (06/29 0600) Resp:  [18-20] 18 (06/29 0600) BP: (115-134)/(81-85) 134/84 mmHg (06/29 0600) SpO2:  [90 %-98 %] 98 % (06/28 1753)  Physical Exam:  General: alert, cooperative and appears stated age CVS:  RRR, without murmur, gallops, or rubs Lungs:  CTA bilat ABD:  +BSx4, normal Lochia: appropriate Uterine Fundus: firm Incision: no significant drainage, dressing clean, dry, and intact.  Wound vac in place. DVT Evaluation: No evidence of DVT seen on physical exam. Negative Homan's sign.  Recent Labs  03/29/14 0710 03/30/14 0908  HGB 10.7* 9.7*  HCT 33.7* 31.0*    Assessment/Plan: Status post Cesarean section. Doing well postoperatively.  Continue current care.  [facility],WALIDAH 03/31/2014, 7:22 AM

## 2014-03-31 NOTE — Progress Notes (Signed)
UR chart review completed.  

## 2014-04-01 ENCOUNTER — Inpatient Hospital Stay (HOSPITAL_COMMUNITY): Admission: RE | Admit: 2014-04-01 | Payer: Medicaid Other | Source: Ambulatory Visit

## 2014-04-01 MED ORDER — IBUPROFEN 600 MG PO TABS: 600.0000 mg | ORAL_TABLET | Freq: Four times a day (QID) | ORAL | Status: DC | PRN

## 2014-04-01 MED ORDER — SENNOSIDES-DOCUSATE SODIUM 8.6-50 MG PO TABS: 2.0000 | ORAL_TABLET | ORAL | Status: DC

## 2014-04-01 MED ORDER — OXYCODONE-ACETAMINOPHEN 5-325 MG PO TABS: 1.0000 | ORAL_TABLET | ORAL | Status: DC | PRN

## 2014-04-01 NOTE — Discharge Summary (Signed)
Obstetric Discharge Summary Reason for Admission: induction of labor Prenatal Procedures: NST Intrapartum Procedures: cesarean: low cervical, transverse Postpartum Procedures: none Complications-Operative and Postpartum: none  Hospital Course: Pt was admitted ofr IOL for GHTN and TOLAC and decided she wanted a c-section. Pt had a repeat c-section and did well postpartum. See op note for details. Pt no issues PP. Wound vacuum in place. Follow up in 1 week for evaluation and removal.     H/H: Lab Results  Component Value Date/Time   HGB 9.7* 03/30/2014  9:08 AM   HCT 31.0* 03/30/2014  9:08 AM    Filed Vitals:   04/01/14 0548  BP: 139/81  Pulse: 84  Temp: 98 F (36.7 C)  Resp: 20    Physical Exam: VSS NAD Abd: Appropriately tender, ND, Fundus @U -2 Incision: c/d/i No c/c/e, Neg homan's sign, neg cords Lochia Appropriate  Discharge Diagnoses: Term Pregnancy-delivered  Discharge Information: Date: 04/14/2011 Activity: pelvic rest Diet: routine  Medications: PNV, Ibuprofen, Colace and Percocet Breast feeding:  Yes Condition: stable Instructions: refer to handout Discharge to: home      Medication List    STOP taking these medications       cephALEXin 500 MG capsule  Commonly known as:  KEFLEX     glucose blood test strip      TAKE these medications       ACCU-CHEK FASTCLIX LANCETS Misc  Inject 1 each into the skin 4 (four) times daily. [phone] for testing 4 times daily     [employer] PRENATAL GUMMY PO  Take 1 tablet by mouth 3 (three) times daily.     ibuprofen 600 MG tablet  Commonly known as:  ADVIL,MOTRIN  Take 1 tablet (600 mg total) by mouth every 6 (six) hours as needed for mild pain.     multivitamin-prenatal 27-0.8 MG Tabs tablet  Take 1 tablet by mouth daily at 12 noon.     oxyCODONE-acetaminophen 5-325 MG per tablet  Commonly known as:  PERCOCET/ROXICET  Take 1-2 tablets by mouth every 4 (four) hours as needed for severe pain (moderate - severe  pain).     senna-docusate 8.6-50 MG per tablet  Commonly known as:  Senokot-S  Take 2 tablets by mouth daily.           Follow-up Information   [facility] (For [facility] Visit)    Specialty:  Obstetrics and Gynecology   Contact information:   [address] [place] [id number] [phone]      Tawana ScaleODOM, MICHAEL RYAN 04/01/2014,8:33 AM

## 2014-04-01 NOTE — Discharge Instructions (Signed)

## 2014-04-16 ENCOUNTER — Encounter: Payer: Self-pay | Admitting: *Deleted

## 2014-04-23 ENCOUNTER — Encounter: Payer: Self-pay | Admitting: *Deleted

## 2014-05-07 ENCOUNTER — Ambulatory Visit (INDEPENDENT_AMBULATORY_CARE_PROVIDER_SITE_OTHER): Payer: Medicaid Other | Admitting: Obstetrics & Gynecology

## 2014-05-07 ENCOUNTER — Encounter: Payer: Self-pay | Admitting: Obstetrics & Gynecology

## 2014-05-07 NOTE — Assessment & Plan Note (Signed)
Well-healed

## 2014-05-07 NOTE — Progress Notes (Signed)
Patient ID: [name], female   DOB: [birth year], 28 y.o.   MRN: [id number] Pt desires Depo Provera for contraception.  She delivered on 03/30/14 and resumed sex on 7/24 without condoms. Last unprotected intercourse was 05/04/14.  Pt informed she will need to abstain from sexual intercourse for 2 weeks, then return for UPT and if negative may receive Depo Provera injection.

## 2014-05-07 NOTE — Progress Notes (Addendum)
Subjective:     Patient ID: Sara Keith, female   DOB: May 03, 1986, 28 y.o.   MRN: [id number]  HPI Comments: 28 y.o G2P1001 obese female who recently had a baby girl   She presents for:  1. Postpartum f/u s/p C-section on 04/01/14 with wound vac in place at the time.  Wound vac fell off and patient did not follow up w/in 1 week and has not taken a look at the incision site.  She reports suprapubic pain intermittently relieved with Ibuprofen and worse with hitting her abdomen against something. Pt c/o blood on tissue when she wiped today and 2 days ago vaginal spotting.  She denies vaginal discharge, dysuria.  2. Abscess to right thigh which was  I&D'ed in OR. Pt completed Bactrim x 10 days but has not looked at site.   3. HTN-variable but today uncontrolled-pt to f/u with PCP Dr. Laurence ComptonArus [place] advised.  Discussed weight loss as well  4. Contraception-pt needs to abstain for sex for 2 weeks prior to pursing Depo 5. Pt requests pap smear today.  [facility] per pt [facility] [place] and from what she remembers was normal.    PCP-Dr. Ferdinand CavaArus now Dr. Alyce PaganMcNamara retired      Review of Systems  Gastrointestinal: Negative for nausea, vomiting and constipation.  Genitourinary: Positive for vaginal bleeding. Negative for dysuria and vaginal discharge.  Neurological: Negative for headaches.       Objective:   Physical Exam  Nursing note and vitals reviewed. Constitutional: She is oriented to person, place, and time. She appears well-developed and well-nourished. She is cooperative. No distress.  HENT:  Head: Normocephalic and atraumatic.  Mouth/Throat: Oropharynx is clear and moist and mucous membranes are normal. No oropharyngeal exudate.  Eyes: Conjunctivae are normal. Pupils are equal, round, and reactive to light. Right eye exhibits no discharge. Left eye exhibits no discharge. No scleral icterus.  Cardiovascular: Normal rate, regular rhythm, S1 normal,  S2 normal and normal heart sounds.   No murmur heard. Pulmonary/Chest: Effort normal and breath sounds normal. No respiratory distress. She has no wheezes.  Abdominal: Soft. Bowel sounds are normal. There is no tenderness.    Genitourinary: Pelvic exam was performed with patient supine.  Blood from cervix. Unable to fully visualize cervix due to body habitus   Neurological: She is alert and oriented to person, place, and time.  Skin: Skin is warm and dry. No rash noted. She is not diaphoretic.     Psychiatric: She has a normal mood and affect. Her speech is normal and behavior is normal. Judgment and thought content normal. Cognition and memory are normal.     Assessment:   Plan:    Agree with note and plan. I saw patient with the resident Adam PhenixJames G Arnold, MD

## 2014-05-07 NOTE — Assessment & Plan Note (Signed)
Doing well with well healed C section site  F/u in 2 weeks for Depo and fasting glucose tolerance test to screen for diabetes  Will try to get [facility] [place]. Otherwise rec pap every 3 years  F/u with PCP Dr. Ferdinand CavaArus for HTN Encouraged weight loss

## 2014-05-07 NOTE — Patient Instructions (Addendum)
Exercise to Lose Weight Exercise and a healthy diet may help you lose weight. Your doctor may suggest specific exercises. EXERCISE IDEAS AND TIPS  Choose low-cost things you enjoy doing, such as walking, bicycling, or exercising to workout videos.  Take stairs instead of the elevator.  Walk during your lunch break.  Park your car further away from work or school.  Go to a gym or an exercise class.  Start with 5 to 10 minutes of exercise each day. Build up to 30 minutes of exercise 4 to 6 days a week.  Wear shoes with good support and comfortable clothes.  Stretch before and after working out.  Work out until you breathe harder and your heart beats faster.  Drink extra water when you exercise.  Do not do so much that you hurt yourself, feel dizzy, or get very short of breath. Exercises that burn about 150 calories:  Running 1  miles in 15 minutes.  Playing volleyball for 45 to 60 minutes.  Washing and waxing a car for 45 to 60 minutes.  Playing touch football for 45 minutes.  Walking 1  miles in 35 minutes.  Pushing a stroller 1  miles in 30 minutes.  Playing basketball for 30 minutes.  Raking leaves for 30 minutes.  Bicycling 5 miles in 30 minutes.  Walking 2 miles in 30 minutes.  Dancing for 30 minutes.  Shoveling snow for 15 minutes.  Swimming laps for 20 minutes.  Walking up stairs for 15 minutes.  Bicycling 4 miles in 15 minutes.  Gardening for 30 to 45 minutes.  Jumping rope for 15 minutes.  Washing windows or floors for 45 to 60 minutes. Document Released: 10/22/2010 Document Revised: 12/12/2011 Document Reviewed: 10/22/2010 [facility] Patient Information 2015 [place], [place]. This information is not intended to replace advice given to you by your health care provider. Make sure you discuss any questions you have with your health care provider.  Hypertension Hypertension, commonly called high blood pressure, is when the force of blood pumping  through your arteries is too strong. Your arteries are the blood vessels that carry blood from your heart throughout your body. A blood pressure reading consists of a higher number over a lower number, such as 110/72. The higher number (systolic) is the pressure inside your arteries when your heart pumps. The lower number (diastolic) is the pressure inside your arteries when your heart relaxes. Ideally you want your blood pressure below 120/80. Hypertension forces your heart to work harder to pump blood. Your arteries may become narrow or stiff. Having hypertension puts you at risk for heart disease, stroke, and other problems.  RISK FACTORS Some risk factors for high blood pressure are controllable. Others are not.  Risk factors you cannot control include:   Race. You may be at higher risk if you are African American.  Age. Risk increases with age.  Gender. Men are at higher risk than women before age 51 years. After age 61, women are at higher risk than men. Risk factors you can control include:  Not getting enough exercise or physical activity.  Being overweight.  Getting too much fat, sugar, calories, or salt in your diet.  Drinking too much alcohol. SIGNS AND SYMPTOMS Hypertension does not usually cause signs or symptoms. Extremely high blood pressure (hypertensive crisis) may cause headache, anxiety, shortness of breath, and nosebleed. DIAGNOSIS  To check if you have hypertension, your health care provider will measure your blood pressure while you are seated, with your arm held  at the level of your heart. It should be measured at least twice using the same arm. Certain conditions can cause a difference in blood pressure between your right and left arms. A blood pressure reading that is higher than normal on one occasion does not mean that you need treatment. If one blood pressure reading is high, ask your health care provider about having it checked again. TREATMENT  Treating high  blood pressure includes making lifestyle changes and possibly taking medicine. Living a healthy lifestyle can help lower high blood pressure. You may need to change some of your habits. Lifestyle changes may include:  Following the DASH diet. This diet is high in fruits, vegetables, and whole grains. It is low in salt, red meat, and added sugars.  Getting at least 2 hours of brisk physical activity every week.  Losing weight if necessary.  Not smoking.  Limiting alcoholic beverages.  Learning ways to reduce stress. If lifestyle changes are not enough to get your blood pressure under control, your health care provider may prescribe medicine. You may need to take more than one. Work closely with your health care provider to understand the risks and benefits. HOME CARE INSTRUCTIONS  Have your blood pressure rechecked as directed by your health care provider.   Take medicines only as directed by your health care provider. Follow the directions carefully. Blood pressure medicines must be taken as prescribed. The medicine does not work as well when you skip doses. Skipping doses also puts you at risk for problems.   Do not smoke.   Monitor your blood pressure at home as directed by your health care provider. SEEK MEDICAL CARE IF:   You think you are having a reaction to medicines taken.  You have recurrent headaches or feel dizzy.  You have swelling in your ankles.  You have trouble with your vision. SEEK IMMEDIATE MEDICAL CARE IF:  You develop a severe headache or confusion.  You have unusual weakness, numbness, or feel faint.  You have severe chest or abdominal pain.  You vomit repeatedly.  You have trouble breathing. MAKE SURE YOU:   Understand these instructions.  Will watch your condition.  Will get help right away if you are not doing well or get worse. Document Released: 09/19/2005 Document Revised: 02/03/2014 Document Reviewed: 07/12/2013 [facility] Patient  Information 2015 [place], [place]. This information is not intended to replace advice given to you by your health care provider. Make sure you discuss any questions you have with your health care provider.  Gestational Diabetes Mellitus Gestational diabetes mellitus, often simply referred to as gestational diabetes, is a type of diabetes that some women develop during pregnancy. In gestational diabetes, the pancreas does not make enough insulin (a hormone), the cells are less responsive to the insulin that is made (insulin resistance), or both.Normally, insulin moves sugars from food into the tissue cells. The tissue cells use the sugars for energy. The lack of insulin or the lack of normal response to insulin causes excess sugars to build up in the blood instead of going into the tissue cells. As a result, high blood sugar (hyperglycemia) develops. The effect of high sugar (glucose) levels can cause many problems.  RISK FACTORS You have an increased chance of developing gestational diabetes if you have a family history of diabetes and also have one or more of the following risk factors:  A body mass index over 30 (obesity).  A previous pregnancy with gestational diabetes.  An older age at the  time of pregnancy. If blood glucose levels are kept in the normal range during pregnancy, women can have a healthy pregnancy. If your blood glucose levels are not well controlled, there may be risks to you, your unborn baby (fetus), your labor and delivery, or your newborn baby.  SYMPTOMS  If symptoms are experienced, they are much like symptoms you would normally expect during pregnancy. The symptoms of gestational diabetes include:   Increased thirst (polydipsia).  Increased urination (polyuria).  Increased urination during the night (nocturia).  Weight loss. This weight loss may be rapid.  Frequent, recurring infections.  Tiredness (fatigue).  Weakness.  Vision changes, such as blurred  vision.  Fruity smell to your breath.  Abdominal pain. DIAGNOSIS Diabetes is diagnosed when blood glucose levels are increased. Your blood glucose level may be checked by one or more of the following blood tests:  A fasting blood glucose test. You will not be allowed to eat for at least 8 hours before a blood sample is taken.  A random blood glucose test. Your blood glucose is checked at any time of the day regardless of when you ate.  A hemoglobin A1c blood glucose test. A hemoglobin A1c test provides information about blood glucose control over the previous 3 months.  An oral glucose tolerance test (OGTT). Your blood glucose is measured after you have not eaten (fasted) for 1-3 hours and then after you drink a glucose-containing beverage. Since the hormones that cause insulin resistance are highest at about 24-28 weeks of a pregnancy, an OGTT is usually performed during that time. If you have risk factors for gestational diabetes, your health care provider may test you for gestational diabetes earlier than 24 weeks of pregnancy. TREATMENT   You will need to take diabetes medicine or insulin daily to keep blood glucose levels in the desired range.  You will need to match insulin dosing with exercise and healthy food choices. The treatment goal is to maintain the before-meal (preprandial), bedtime, and overnight blood glucose level at 60-99 mg/dL during pregnancy. The treatment goal is to further maintain peak after-meal blood sugar (postprandial glucose) level at 100-140 mg/dL. HOME CARE INSTRUCTIONS   Have your hemoglobin A1c level checked twice a year.  Perform daily blood glucose monitoring as directed by your health care provider. It is common to perform frequent blood glucose monitoring.  Monitor urine ketones when you are ill and as directed by your health care provider.  Take your diabetes medicine and insulin as directed by your health care provider to maintain your blood  glucose level in the desired range.  Never run out of diabetes medicine or insulin. It is needed every day.  Adjust insulin based on your intake of carbohydrates. Carbohydrates can raise blood glucose levels but need to be included in your diet. Carbohydrates provide vitamins, minerals, and fiber which are an essential part of a healthy diet. Carbohydrates are found in fruits, vegetables, whole grains, dairy products, legumes, and foods containing added sugars.  Eat healthy foods. Alternate 3 meals with 3 snacks.  Maintain a healthy weight gain. The usual total expected weight gain varies according to your prepregnancy body mass index (BMI).  Carry a medical alert card or wear your medical alert jewelry.  Carry a 15-gram carbohydrate snack with you at all times to treat low blood glucose (hypoglycemia). Some examples of 15-gram carbohydrate snacks include:  Glucose tablets, 3 or 4.  Glucose gel, 15-gram tube.  Raisins, 2 tablespoons (24 g).  Jelly beans, 6.  Animal crackers, 8.  Fruit juice, regular soda, or low-fat milk, 4 ounces (120 mL).  Gummy treats, 9.  Recognize hypoglycemia. Hypoglycemia during pregnancy occurs with blood glucose levels of 60 mg/dL and below. The risk for hypoglycemia increases when fasting or skipping meals, during or after intense exercise, and during sleep. Hypoglycemia symptoms can include:  Tremors or shakes.  Decreased ability to concentrate.  Sweating.  Increased heart rate.  Headache.  Dry mouth.  Hunger.  Irritability.  Anxiety.  Restless sleep.  Altered speech or coordination.  Confusion.  Treat hypoglycemia promptly. If you are alert and able to safely swallow, follow the 15:15 rule:  Take 15-20 grams of rapid-acting glucose or carbohydrate. Rapid-acting options include glucose gel, glucose tablets, or 4 ounces (120 mL) of fruit juice, regular soda, or low-fat milk.  Check your blood glucose level 15 minutes after taking  the glucose.  Take 15-20 grams more of glucose if the repeat blood glucose level is still 70 mg/dL or below.  Eat a meal or snack within 1 hour once blood glucose levels return to normal.  Be alert to polyuria (excess urination) and polydipsia (excess thirst) which are early signs of hyperglycemia. An early awareness of hyperglycemia allows for prompt treatment. Treat hyperglycemia as directed by your health care provider.  Engage in at least 30 minutes of physical activity a day or as directed by your health care provider. Ten minutes of physical activity timed 30 minutes after each meal is encouraged to control postprandial blood glucose levels.  Adjust your insulin dosing and food intake as needed if you start a new exercise or sport.  Follow your sick-day plan at any time you are unable to eat or drink as usual.  Avoid tobacco and alcohol use.  Keep all follow-up visits as directed by your health care provider.  Follow the advice of your health care provider regarding your prenatal and post-delivery (postpartum) appointments, meal planning, exercise, medicines, vitamins, blood tests, other medical tests, and physical activities.  Perform daily skin and foot care. Examine your skin and feet daily for cuts, bruises, redness, nail problems, bleeding, blisters, or sores.  Brush your teeth and gums at least twice a day and floss at least once a day. Follow up with your dentist regularly.  Schedule an eye exam during the first trimester of your pregnancy or as directed by your health care provider.  Share your diabetes management plan with your workplace or school.  Stay up-to-date with immunizations.  Learn to manage stress.  Obtain ongoing diabetes education and support as needed.  Learn about and consider breastfeeding your baby.  You should have your blood sugar level checked 6-12 weeks after delivery. This is done with an oral glucose tolerance test (OGTT). SEEK MEDICAL CARE  IF:   You are unable to eat food or drink fluids for more than 6 hours.  You have nausea and vomiting for more than 6 hours.  You have a blood glucose level of 200 mg/dL and you have ketones in your urine.  There is a change in mental status.  You develop vision problems.  You have a persistent headache.  You have upper abdominal pain or discomfort.  You develop an additional serious illness.  You have diarrhea for more than 6 hours.  You have been sick or have had a fever for a couple of days and are not getting better. SEEK IMMEDIATE MEDICAL CARE IF:   You have difficulty breathing.  You no longer feel the  baby moving.  You are bleeding or have discharge from your vagina.  You start having premature contractions or labor. MAKE SURE YOU:  Understand these instructions.  Will watch your condition.  Will get help right away if you are not doing well or get worse. Document Released: 12/26/2000 Document Revised: 02/03/2014 Document Reviewed: 04/17/2012 [facility] Patient Information 2015 [place], [place]. This information is not intended to replace advice given to you by your health care provider. Make sure you discuss any questions you have with your health care provider.

## 2014-08-04 ENCOUNTER — Encounter: Payer: Self-pay | Admitting: Obstetrics & Gynecology

## 2014-09-05 ENCOUNTER — Encounter: Payer: Self-pay | Admitting: Obstetrics & Gynecology

## 2015-02-08 ENCOUNTER — Emergency Department (HOSPITAL_BASED_OUTPATIENT_CLINIC_OR_DEPARTMENT_OTHER)
Admission: EM | Admit: 2015-02-08 | Discharge: 2015-02-08 | Disposition: A | Discharge status: Home / Self Care | Payer: Medicaid Other | Attending: Emergency Medicine | Admitting: Emergency Medicine

## 2015-02-08 ENCOUNTER — Encounter (HOSPITAL_BASED_OUTPATIENT_CLINIC_OR_DEPARTMENT_OTHER): Payer: Self-pay | Admitting: *Deleted

## 2015-02-08 DIAGNOSIS — Z3202 Encounter for pregnancy test, result negative: Secondary | ICD-10-CM | POA: Diagnosis not present

## 2015-02-08 DIAGNOSIS — Z79899 Other long term (current) drug therapy: Secondary | ICD-10-CM | POA: Diagnosis not present

## 2015-02-08 DIAGNOSIS — R519 Headache, unspecified: Secondary | ICD-10-CM

## 2015-02-08 DIAGNOSIS — Z87891 Personal history of nicotine dependence: Secondary | ICD-10-CM | POA: Diagnosis not present

## 2015-02-08 DIAGNOSIS — R51 Headache: Secondary | ICD-10-CM | POA: Insufficient documentation

## 2015-02-08 DIAGNOSIS — Z8632 Personal history of gestational diabetes: Secondary | ICD-10-CM | POA: Diagnosis not present

## 2015-02-08 LAB — PREGNANCY, URINE: Preg Test, Ur: NEGATIVE

## 2015-02-08 MED ORDER — KETOROLAC TROMETHAMINE 60 MG/2ML IM SOLN
60.0000 mg | Freq: Once | INTRAMUSCULAR | Status: AC
  Administered 2015-02-08: 60 mg via INTRAMUSCULAR
  Filled 2015-02-08: qty 2

## 2015-02-08 MED ORDER — ONDANSETRON HCL 8 MG PO TABS
4.0000 mg | ORAL_TABLET | Freq: Once | ORAL | Status: AC
  Administered 2015-02-08: 4 mg via ORAL
  Filled 2015-02-08: qty 1

## 2015-02-08 NOTE — ED Notes (Signed)
PA at bedside.

## 2015-02-08 NOTE — ED Provider Notes (Signed)
CSN: [id number]     Arrival date & time 02/08/15  1204 History   First MD Initiated Contact with Patient 02/08/15 1222     Chief Complaint  Patient presents with  . Headache     (Consider location/radiation/quality/duration/timing/severity/associated sxs/prior Treatment) HPI  Sara Keith is a 29 y.o. female presenting with headache since last night. Headache developed gradually and is diffuse described as throbbing with associated photophobia and nausea. Patient states is similar 22 had migraines when pregnant. Patient states her symptoms are aggravated by her crying daughter. She has not taken anything for her symptoms. No vomiting. No neck pain, fevers, chills. No visual changes or slurred speech or weakness.   Past Medical History  Diagnosis Date  . Gestational diabetes   . Pregnancy induced hypertension    Past Surgical History  Procedure Laterality Date  . Sweat glan    . Cesarean section    . Cesarean section N/A 03/29/2014    Procedure: CESAREAN SECTION;  Surgeon: Tereso NewcomerUgonna A Anyanwu, MD;  Location: [facility];  Service: Obstetrics;  Laterality: N/A;   Family History  Problem Relation Age of Onset  . Kidney disease Mother   . Diabetes Father    History  Substance Use Topics  . Smoking status: Former Smoker    Types: Cigarettes    Quit date: 07/03/2013  . Smokeless tobacco: Never Used  . Alcohol Use: Yes     Comment: occasionally   OB History    Gravida Para Term Preterm AB TAB SAB Ectopic Multiple Living   2 2 2       2      Review of Systems 10 Systems reviewed and are negative for acute change except as noted in the HPI.    Allergies  Review of patient's allergies indicates no known allergies.  Home Medications   Prior to Admission medications   Medication Sig Start Date End Date Taking? Authorizing Provider  ibuprofen (ADVIL,MOTRIN) 600 MG tablet Take 1 tablet (600 mg total) by mouth every 6 (six) hours as needed for mild pain. 04/01/14  Yes Minta BalsamMichael R  Odom, MD  ACCU-CHEK FASTCLIX LANCETS MISC Inject 1 each into the skin 4 (four) times daily. [phone] for testing 4 times daily 03/24/14   Adam PhenixJames G Arnold, MD  oxyCODONE-acetaminophen (PERCOCET/ROXICET) 5-325 MG per tablet Take 1-2 tablets by mouth every 4 (four) hours as needed for severe pain (moderate - severe pain). 04/01/14   Minta BalsamMichael R Odom, MD  Prenatal Vit-Fe Fumarate-FA (MULTIVITAMIN-PRENATAL) 27-0.8 MG TABS tablet Take 1 tablet by mouth daily at 12 noon.    Historical Provider, MD  Prenatal Vit-Min-FA-Fish Oil ([facility] PRENATAL GUMMY PO) Take 1 tablet by mouth 3 (three) times daily.    Historical Provider, MD  senna-docusate (SENOKOT-S) 8.6-50 MG per tablet Take 2 tablets by mouth daily. 04/01/14   Minta BalsamMichael R Odom, MD   BP 136/89 mmHg  Pulse 64  Temp(Src) 98.1 F (36.7 C) (Oral)  Resp 20  Ht 5\' 3"  (1.6 m)  Wt 282 lb (127.914 kg)  BMI 49.97 kg/m2  SpO2 100% Physical Exam  Constitutional: She appears well-developed and well-nourished. No distress.  HENT:  Head: Normocephalic and atraumatic.  Mouth/Throat: Oropharynx is clear and moist.  Eyes: Conjunctivae and EOM are normal. Pupils are equal, round, and reactive to light. Right eye exhibits no discharge. Left eye exhibits no discharge.  Neck: Normal range of motion. Neck supple.  No nuchal rigidity  Cardiovascular: Normal rate and regular rhythm.   Pulmonary/Chest: Effort normal  and breath sounds normal. No respiratory distress. She has no wheezes.  Abdominal: Soft. Bowel sounds are normal. She exhibits no distension. There is no tenderness.  Neurological: She is alert. No cranial nerve deficit. Coordination normal.  Speech is clear and goal oriented. Strength 5/5 in upper and lower extremities. Sensation intact. Intact rapid alternating movements, finger to nose, and heel to shin. No pronator drift. Normal gait.   Skin: Skin is warm and dry. She is not diaphoretic.  Nursing note and vitals reviewed.   ED Course  Procedures (including  critical care time) Labs Review Labs Reviewed  PREGNANCY, URINE    Imaging Review No results found.   EKG Interpretation None      MDM   Final diagnoses:  Nonintractable headache   Patient presenting with child who is febrile with pneumonia. Patient presenting with headache. Headaches not worst of life, sudden maximal intensity. VSS. Neurological exam without deficits. Patient nonpregnant given Toradol in the ED with improvement of her symptoms. I doubt intracranial hemorrhage, subarachnoid, meningitis. Patient to follow-up with PCP. Patient well-appearing and is stable for discharge.  Discussed return precautions with patient. Discussed all results and patient verbalizes understanding and agrees with plan.    Oswaldo ConroyVictoria Coree Riester, PA-C 02/08/15 1652  Benjiman CoreNathan Pickering, MD 02/09/15 [phone]

## 2015-02-08 NOTE — ED Notes (Addendum)
Pt c/o headache x 1 day with nausea. States she may be pregnant

## 2015-02-08 NOTE — ED Notes (Signed)
Mother reports diffuse ha with photophobia, states she has had migraines previously when pregnant and may be pregnant.  Of note has crying child currently who is sick with her to aggravate symptoms.  Nausea.

## 2015-02-08 NOTE — Discharge Instructions (Signed)
Return to the emergency room with worsening of symptoms, new symptoms or with symptoms that are concerning , especially severe worsening of headache, visual or speech changes, weakness in face, arms or legs. °Please call your doctor for a followup appointment within 24-48 hours. When you talk to your doctor please let them know that you were seen in the emergency department and have them acquire all of your records so that they can discuss the findings with you and formulate a treatment plan to fully care for your new and ongoing problems.  °Read below information and follow recommendations. °General Headache Without Cause °A headache is pain or discomfort felt around the head or neck area. The specific cause of a headache may not be found. There are many causes and types of headaches. A few common ones are: °· Tension headaches. °· Migraine headaches. °· Cluster headaches. °· Chronic daily headaches. °HOME CARE INSTRUCTIONS  °· Keep all follow-up appointments with your caregiver or any specialist referral. °· Only take over-the-counter or prescription medicines for pain or discomfort as directed by your caregiver. °· Lie down in a dark, quiet room when you have a headache. °· Keep a headache journal to find out what may trigger your migraine headaches. For example, write down: °¨ What you eat and drink. °¨ How much sleep you get. °¨ Any change to your diet or medicines. °· Try massage or other relaxation techniques. °· Put ice packs or heat on the head and neck. Use these 3 to 4 times per day for 15 to 20 minutes each time, or as needed. °· Limit stress. °· Sit up straight, and do not tense your muscles. °· Quit smoking if you smoke. °· Limit alcohol use. °· Decrease the amount of caffeine you drink, or stop drinking caffeine. °· Eat and sleep on a regular schedule. °· Get 7 to 9 hours of sleep, or as recommended by your caregiver. °· Keep lights dim if bright lights bother you and make your headaches worse. °SEEK  MEDICAL CARE IF:  °· You have problems with the medicines you were prescribed. °· Your medicines are not working. °· You have a change from the usual headache. °· You have nausea or vomiting. °SEEK IMMEDIATE MEDICAL CARE IF:  °· Your headache becomes severe. °· You have a fever. °· You have a stiff neck. °· You have loss of vision. °· You have muscular weakness or loss of muscle control. °· You start losing your balance or have trouble walking. °· You feel faint or pass out. °· You have severe symptoms that are different from your first symptoms. °MAKE SURE YOU:  °· Understand these instructions. °· Will watch your condition. °· Will get help right away if you are not doing well or get worse. °Document Released: 09/19/2005 Document Revised: 12/12/2011 Document Reviewed: 10/05/2011 °[employer]® Patient Information ©2015 [employer]. This information is not intended to replace advice given to you by your health care provider. Make sure you discuss any questions you have with your health care provider. ° °

## 2015-04-20 IMAGING — US US OB FOLLOW-UP
1 series · 12 of 28 positions shown · non-contrast
Comparison: none

[Series 1: us ob follow-up · 0.23mm/px · 12 of 29 slices shown]
[im 2/29]
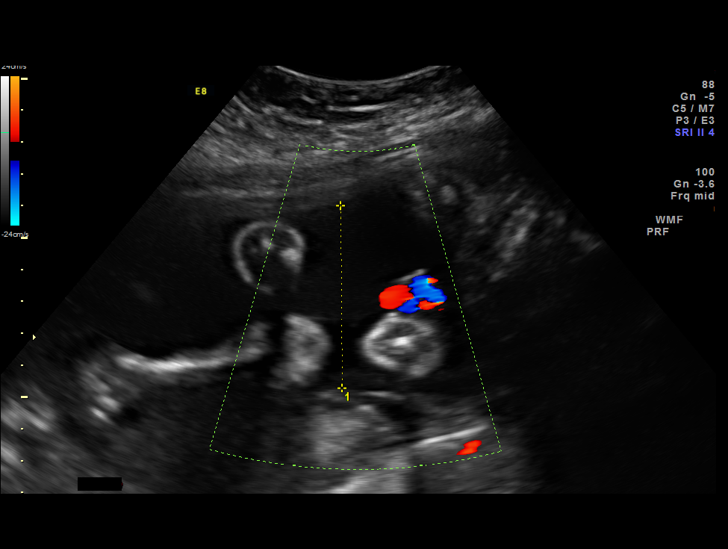
[im 4/29]
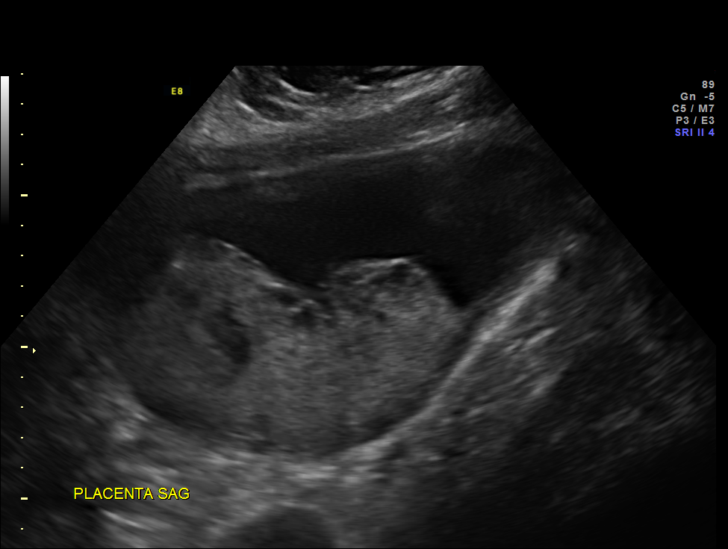
[im 6/29]
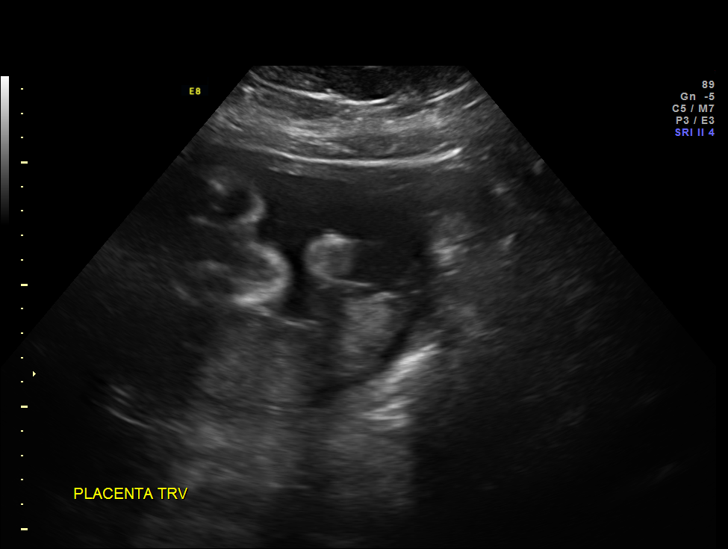
[im 9/29]
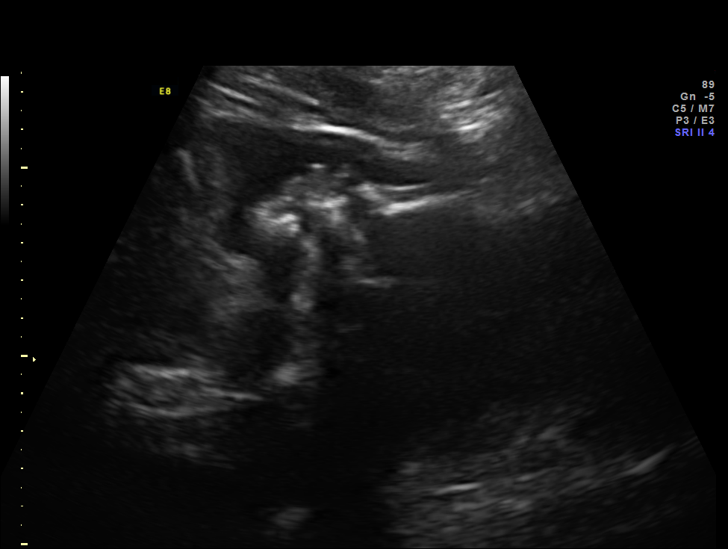
[im 11/29]
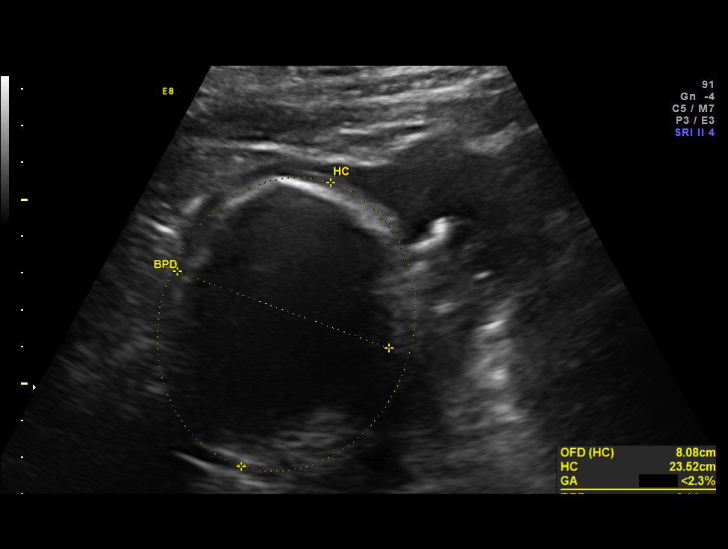
[im 13/29]
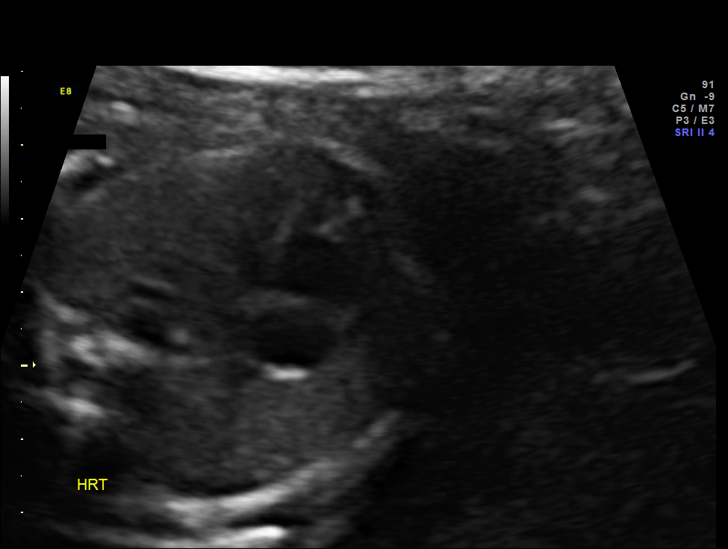
[im 16/29]
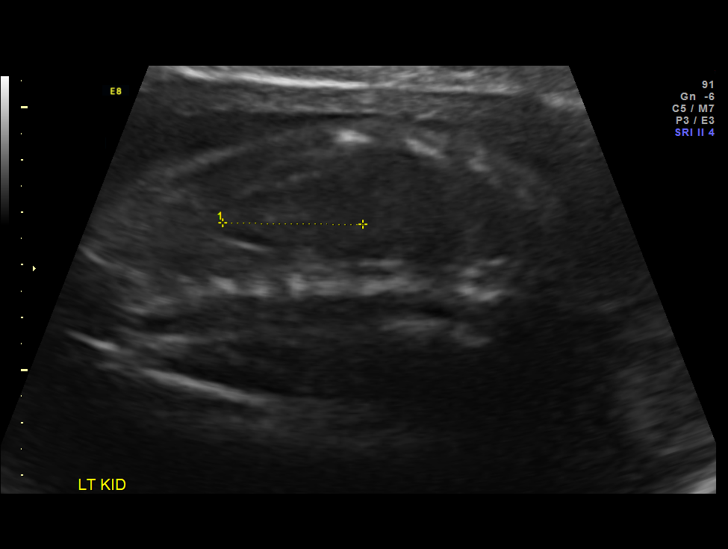
[im 18/29]
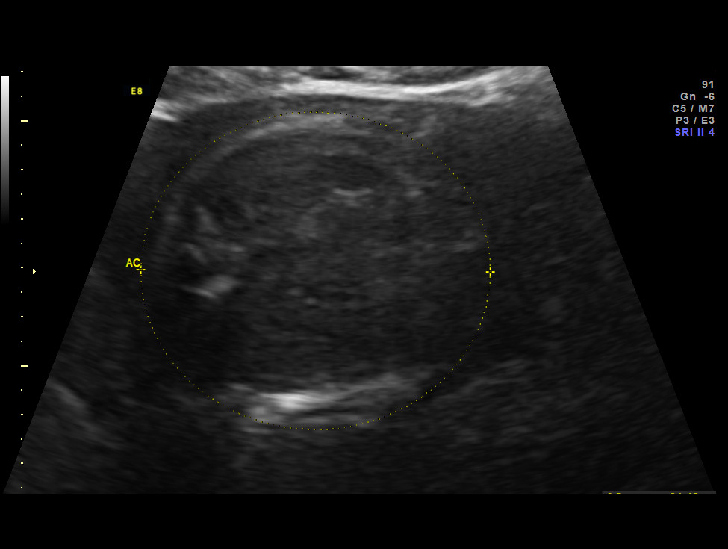
[im 20/29]
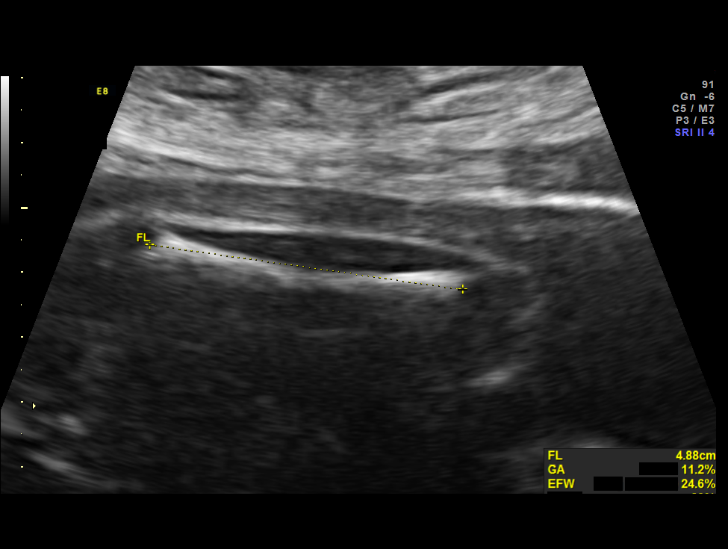
[im 23/29]
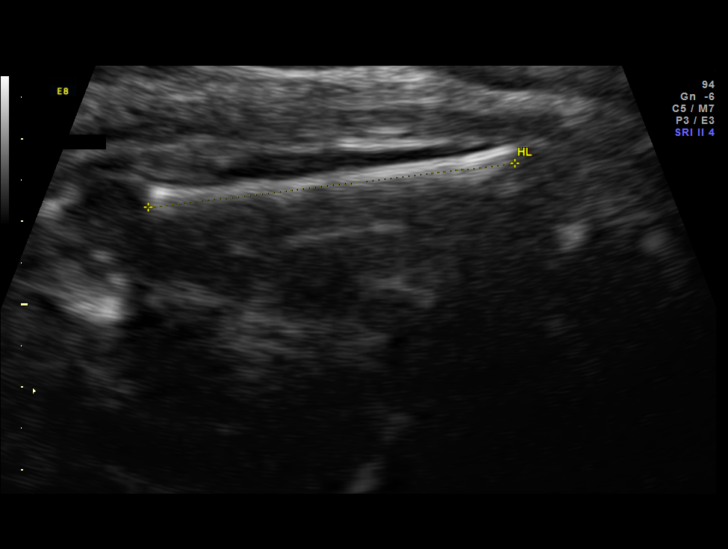
[im 25/29]
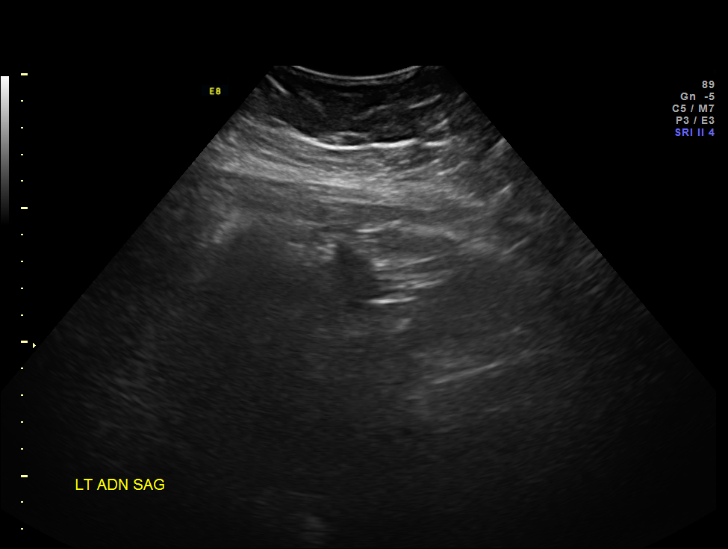
[im 27/29]
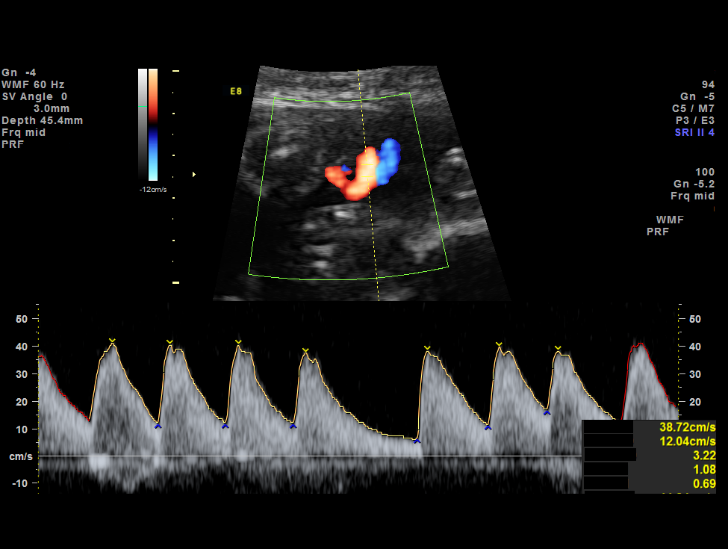

[12 of 28 positions shown; findings below may reference images not displayed]

OBSTETRICS REPORT
                      (Signed Final 01/10/2014 [DATE])

Service(s) Provided

 US OB FOLLOW UP                                       76816.1
 US UA CORD DOPPLER                                    76820.0
Indications

 2 vessel umbilical cord
 Previous cesarean section
 Maternal morbid obesity
 Normal Fetal ECHO
Fetal Evaluation

 Num Of Fetuses:    1
 Fetal Heart Rate:  159                          bpm
 Cardiac Activity:  Observed
 Presentation:      Cephalic
 Placenta:          Posterior, above cervical
                    os
 P. Cord            Previously Visualized
 Insertion:

 Amniotic Fluid
 AFI FV:      Subjectively within normal limits
                                             Larg Pckt:     5.7  cm
Biometry

 BPD:     60.5  mm     G. Age:  24w 5d                CI:         76.0   70 - 86
 OFD:     79.6  mm                                    FL/HC:      21.0   18.6 -

 HC:     232.6  mm     G. Age:  25w 2d      < 3  %    HC/AC:      1.09   1.05 -

 AC:     213.6  mm     G. Age:  25w 6d        8  %    FL/BPD:     80.8   71 - 87
 FL:      48.9  mm     G. Age:  26w 3d       12  %    FL/AC:      22.9   20 - 24
 HUM:     44.9  mm     G. Age:  26w 4d       28  %

 Est. FW:     872  gm    1 lb 15 oz      23  %
Gestational Age

 LMP:           27w 3d        Date:  07/02/13                 EDD:   04/08/14
 U/S Today:     25w 4d                                        EDD:   04/21/14
 Best:          27w 3d     Det. By:  LMP  (07/02/13)          EDD:   04/08/14
Anatomy

 Cranium:          Appears normal         Aortic Arch:      Not well visualized
 Fetal Cavum:      Previously seen        Ductal Arch:      Not well visualized
 Ventricles:       Previously seen        Diaphragm:        Previously seen
 Choroid Plexus:   Previously seen        Stomach:          Appears normal, left
                                                            sided
 Cerebellum:       Previously seen        Abdomen:          Appears normal
 Posterior Fossa:  Previously seen        Abdominal Wall:   Previously seen
 Nuchal Fold:      Previously seen        Cord Vessels:     2 vessel cord,
                                                            absent right Kun
                                                            Sri Lestari
 Face:             Orbits and profile     Kidneys:          Appear normal
                   previously seen
 Lips:             Previously seen        Bladder:          Appears normal
 Palate:           Previously seen        Spine:            Previously seen
 Heart:            Appears normal         Lower             Previously seen
                   (4CH, axis, and        Extremities:
                   situs)
 RVOT:             Previously seen        Upper             Previously seen
                                          Extremities:
 LVOT:             Previously seen

 Other:  Female gender previously seen. Technically difficult due to maternal
         habitus and fetal position.
Doppler - Fetal Vessels

 Umbilical Artery
 S/D:   3.39           62  %tile       RI:
 PI:    1.14                           PSV:       41.4    cm/s
 Umbilical Artery
 Absent DFV:    No     Reverse DFV:    No

Cervix Uterus Adnexa

 Cervical Length:    3        cm

 Cervix:       Normal appearance by transabdominal scan.

 Adnexa:     No abnormality visualized.
Impression

 SIUP at 27+3 weeks
 Single umbilical artery
 Normal interval anatomy; anatomic survey complete except
 for arches (normal ECHO)
 Normal amniotic fluid volume
 EFW at the 23rd %tile; AC at the 8th %tile; good interval
 growth
 UA dopplers were normal for this GA
Recommendations

 Follow-up ultrasound for growth in 3 weeks

## 2015-05-06 ENCOUNTER — Encounter (HOSPITAL_BASED_OUTPATIENT_CLINIC_OR_DEPARTMENT_OTHER): Payer: Self-pay | Admitting: *Deleted

## 2015-05-06 ENCOUNTER — Emergency Department (HOSPITAL_BASED_OUTPATIENT_CLINIC_OR_DEPARTMENT_OTHER)
Admission: EM | Admit: 2015-05-06 | Discharge: 2015-05-06 | Disposition: A | Discharge status: Home / Self Care | Payer: Medicaid Other | Attending: Emergency Medicine | Admitting: Emergency Medicine

## 2015-05-06 DIAGNOSIS — Z3202 Encounter for pregnancy test, result negative: Secondary | ICD-10-CM | POA: Insufficient documentation

## 2015-05-06 DIAGNOSIS — Z79899 Other long term (current) drug therapy: Secondary | ICD-10-CM | POA: Insufficient documentation

## 2015-05-06 DIAGNOSIS — H9201 Otalgia, right ear: Secondary | ICD-10-CM | POA: Diagnosis present

## 2015-05-06 DIAGNOSIS — H66001 Acute suppurative otitis media without spontaneous rupture of ear drum, right ear: Secondary | ICD-10-CM | POA: Insufficient documentation

## 2015-05-06 DIAGNOSIS — M542 Cervicalgia: Secondary | ICD-10-CM | POA: Diagnosis not present

## 2015-05-06 DIAGNOSIS — Z8632 Personal history of gestational diabetes: Secondary | ICD-10-CM | POA: Insufficient documentation

## 2015-05-06 DIAGNOSIS — R51 Headache: Secondary | ICD-10-CM | POA: Insufficient documentation

## 2015-05-06 DIAGNOSIS — Z87891 Personal history of nicotine dependence: Secondary | ICD-10-CM | POA: Insufficient documentation

## 2015-05-06 DIAGNOSIS — K088 Other specified disorders of teeth and supporting structures: Secondary | ICD-10-CM | POA: Insufficient documentation

## 2015-05-06 DIAGNOSIS — K0889 Other specified disorders of teeth and supporting structures: Secondary | ICD-10-CM

## 2015-05-06 LAB — URINALYSIS, ROUTINE W REFLEX MICROSCOPIC
Bilirubin Urine: NEGATIVE
GLUCOSE, UA: NEGATIVE mg/dL
HGB URINE DIPSTICK: NEGATIVE
Ketones, ur: NEGATIVE mg/dL
LEUKOCYTES UA: NEGATIVE
Nitrite: NEGATIVE
PH: 6 (ref 5.0–8.0)
PROTEIN: NEGATIVE mg/dL
Specific Gravity, Urine: 1.019 (ref 1.005–1.030)
Urobilinogen, UA: 0.2 mg/dL (ref 0.0–1.0)

## 2015-05-06 LAB — PREGNANCY, URINE: Preg Test, Ur: NEGATIVE

## 2015-05-06 MED ORDER — ONDANSETRON HCL 4 MG PO TABS: 4.0000 mg | ORAL_TABLET | Freq: Four times a day (QID) | ORAL | Status: DC

## 2015-05-06 MED ORDER — AMOXICILLIN 500 MG PO CAPS
500.0000 mg | ORAL_CAPSULE | Freq: Once | ORAL | Status: AC
  Administered 2015-05-06: 500 mg via ORAL
  Filled 2015-05-06: qty 1

## 2015-05-06 MED ORDER — AMOXICILLIN 500 MG PO CAPS: 500.0000 mg | ORAL_CAPSULE | Freq: Three times a day (TID) | ORAL | Status: DC

## 2015-05-06 MED ORDER — KETOROLAC TROMETHAMINE 60 MG/2ML IM SOLN
60.0000 mg | Freq: Once | INTRAMUSCULAR | Status: AC
  Administered 2015-05-06: 60 mg via INTRAMUSCULAR
  Filled 2015-05-06: qty 2

## 2015-05-06 NOTE — ED Provider Notes (Signed)
CSN: [id number]     Arrival date & time 05/06/15  0129 History   First MD Initiated Contact with Patient 05/06/15 0151     Chief Complaint  Patient presents with  . Facial Swelling  . Facial Pain      The history is provided by the patient. No language interpreter was used.   Ms. Arvizu presents for evaluation of multiple complaints.  She has neck pain, ear pain, headache, jaw pain.  First symptom was neck pain, posteriorly, started a few days ago and felt like it needed to be cracked.  No injuries.  Denies fever, vomiting, sore throat, numbness, weakness, vision changes.  She has had pain throughout her lower jaw near the posterior mandible for several days. She is scheduled to get her wisdom teeth removed in the next 5 days. She has bilateral ear pain, diffuse headache. No sore throat, cough, vomiting   Past Medical History  Diagnosis Date  . Gestational diabetes   . Pregnancy induced hypertension    Past Surgical History  Procedure Laterality Date  . Sweat glan    . Cesarean section    . Cesarean section N/A 03/29/2014    Procedure: CESAREAN SECTION;  Surgeon: Tereso Newcomer, MD;  Location: [facility];  Service: Obstetrics;  Laterality: N/A;   Family History  Problem Relation Age of Onset  . Kidney disease Mother   . Diabetes Father    History  Substance Use Topics  . Smoking status: Former Smoker    Types: Cigarettes    Quit date: 07/03/2013  . Smokeless tobacco: Never Used  . Alcohol Use: Yes     Comment: occasionally   OB History    Gravida Para Term Preterm AB TAB SAB Ectopic Multiple Living   Review of Systems  All other systems reviewed and are negative.      Allergies  Review of patient's allergies indicates no known allergies.  Home Medications   Prior to Admission medications   Medication Sig Start Date End Date Taking? Authorizing Provider  ACCU-CHEK FASTCLIX LANCETS MISC Inject 1 each into the skin 4 (four) times daily. 119.14 for  testing 4 times daily 03/24/14   Adam Phenix, MD  ibuprofen (ADVIL,MOTRIN) 600 MG tablet Take 1 tablet (600 mg total) by mouth every 6 (six) hours as needed for mild pain. 04/01/14   Minta Balsam, MD  oxyCODONE-acetaminophen (PERCOCET/ROXICET) 5-325 MG per tablet Take 1-2 tablets by mouth every 4 (four) hours as needed for severe pain (moderate - severe pain). 04/01/14   Minta Balsam, MD  Prenatal Vit-Fe Fumarate-FA (MULTIVITAMIN-PRENATAL) 27-0.8 MG TABS tablet Take 1 tablet by mouth daily at 12 noon.    Historical Provider, MD  Prenatal Vit-Min-FA-Fish Oil ([facility] PRENATAL GUMMY PO) Take 1 tablet by mouth 3 (three) times daily.    Historical Provider, MD  senna-docusate (SENOKOT-S) 8.6-50 MG per tablet Take 2 tablets by mouth daily. 04/01/14   Minta Balsam, MD   BP 152/103 mmHg  Pulse 78  Temp(Src) 99.2 F (37.3 C) (Oral)  Wt 283 lb (128.368 kg)  SpO2 98% Physical Exam  Constitutional: She is oriented to person, place, and time. She appears well-developed and well-nourished.  HENT:  Head: Normocephalic and atraumatic.  Left Ear: External ear normal.  Right TM with erythema and slight bulging. There is diffuse tenderness along the mandible, greatest over TMJs bilaterally Patient is able to open and  close the mouth without difficulty. No trismus. No swelling of the posterior oropharynx. No discrete gingival induration or tenderness..  Cardiovascular: Normal rate and regular rhythm.   No murmur heard. Pulmonary/Chest: Effort normal and breath sounds normal. No respiratory distress.  Musculoskeletal: She exhibits no edema or tenderness.  Neurological: She is alert and oriented to person, place, and time. No cranial nerve deficit.  Skin: Skin is warm and dry.  Psychiatric: She has a normal mood and affect. Her behavior is normal.  Nursing note and vitals reviewed.   ED Course  Procedures (including critical care time) Labs Review Labs Reviewed - No data to display  Imaging Review No  results found.   EKG Interpretation None      MDM   Final diagnoses:  None    Pt here for evaluation of HA, neck pain, jaw pain, ear ache.  Exam is c/w AOM on the right.  Exam is not c/w meningitis, deep tissue space infection, mastoiditis, PTA/RPA, serious dental infection.  Discussed with pt home care and outpatient follow up.  Return precautions were discussed.      Tilden Fossa, MD 05/06/15 [id number]

## 2015-05-06 NOTE — Discharge Instructions (Signed)
Dental Pain A tooth ache may be caused by cavities (tooth decay). Cavities expose the nerve of the tooth to air and hot or cold temperatures. It may come from an infection or abscess (also called a boil or furuncle) around your tooth. It is also often caused by dental caries (tooth decay). This causes the pain you are having. DIAGNOSIS  Your caregiver can diagnose this problem by exam. TREATMENT   If caused by an infection, it may be treated with medications which kill germs (antibiotics) and pain medications as prescribed by your caregiver. Take medications as directed.  Only take over-the-counter or prescription medicines for pain, discomfort, or fever as directed by your caregiver.  Whether the tooth ache today is caused by infection or dental disease, you should see your dentist as soon as possible for further care. SEEK MEDICAL CARE IF: The exam and treatment you received today has been provided on an emergency basis only. This is not a substitute for complete medical or dental care. If your problem worsens or new problems (symptoms) appear, and you are unable to meet with your dentist, call or return to this location. SEEK IMMEDIATE MEDICAL CARE IF:   You have a fever.  You develop redness and swelling of your face, jaw, or neck.  You are unable to open your mouth.  You have severe pain uncontrolled by pain medicine. MAKE SURE YOU:   Understand these instructions.  Will watch your condition.  Will get help right away if you are not doing well or get worse. Document Released: 09/19/2005 Document Revised: 12/12/2011 Document Reviewed: 05/07/2008 [facility] Patient Information 2015 [place], [place]. This information is not intended to replace advice given to you by your health care provider. Make sure you discuss any questions you have with your health care provider.  Otitis Media Otitis media is redness, soreness, and inflammation of the middle ear. Otitis media may be caused by  allergies or, most commonly, by infection. Often it occurs as a complication of the common cold. SIGNS AND SYMPTOMS Symptoms of otitis media may include:  Earache.  Fever.  Ringing in your ear.  Headache.  Leakage of fluid from the ear. DIAGNOSIS To diagnose otitis media, your health care provider will examine your ear with an otoscope. This is an instrument that allows your health care provider to see into your ear in order to examine your eardrum. Your health care provider also will ask you questions about your symptoms. TREATMENT  Typically, otitis media resolves on its own within 3-5 days. Your health care provider may prescribe medicine to ease your symptoms of pain. If otitis media does not resolve within 5 days or is recurrent, your health care provider may prescribe antibiotic medicines if he or she suspects that a bacterial infection is the cause. HOME CARE INSTRUCTIONS   If you were prescribed an antibiotic medicine, finish it all even if you start to feel better.  Take medicines only as directed by your health care provider.  Keep all follow-up visits as directed by your health care provider. SEEK MEDICAL CARE IF:  You have otitis media only in one ear, or bleeding from your nose, or both.  You notice a lump on your neck.  You are not getting better in 3-5 days.  You feel worse instead of better. SEEK IMMEDIATE MEDICAL CARE IF:   You have pain that is not controlled with medicine.  You have swelling, redness, or pain around your ear or stiffness in your neck.  You  notice that part of your face is paralyzed.  You notice that the bone behind your ear (mastoid) is tender when you touch it. MAKE SURE YOU:   Understand these instructions.  Will watch your condition.  Will get help right away if you are not doing well or get worse. Document Released: 06/24/2004 Document Revised: 02/03/2014 Document Reviewed: 04/16/2013 [facility] Patient Information 2015  [place], [place]. This information is not intended to replace advice given to you by your health care provider. Make sure you discuss any questions you have with your health care provider.

## 2015-05-06 NOTE — ED Notes (Signed)
Left sided facial pain and swelling x 4 days bilat wisdom tooth pain denies fever N/V

## 2015-05-11 IMAGING — US US OB FOLLOW-UP
1 series · 16 of 28 positions shown · non-contrast
Comparison: none

OBSTETRICS REPORT
                      (Signed Final 01/31/2014 [DATE])

Service(s) Provided
 US OB FOLLOW UP                                       76816.1
 US UA CORD DOPPLER                                    76820.0
Indications
 2 vessel umbilical cord
 Size less than dates (AC 8%)
 Maternal morbid obesity (305 lb)
 Normal Fetal ECHO
 History of cesarean delivery, currently pregnant      654.20,
Fetal Evaluation
 Num Of Fetuses:    1
 Fetal Heart Rate:  148                          bpm
 Cardiac Activity:  Observed
 Presentation:      Cephalic
 Placenta:          Posterior, above cervical
                    os
 P. Cord            Previously Visualized
 Insertion:
 Amniotic Fluid
 AFI FV:      Subjectively within normal limits
 AFI Sum:     14.97   cm       53  %Tile     Larg Pckt:    5.82  cm
 RUQ:   4.27    cm   RLQ:    2.2    cm    LUQ:   2.68    cm   LLQ:    5.82   cm
Biometry
 BPD:     72.8  mm     G. Age:  29w 2d                CI:         78.9   70 - 86
 OFD:     92.3  mm                                    FL/HC:      21.6   19.2 -
 HC:       265  mm     G. Age:  28w 6d      < 3  %    HC/AC:      1.09   0.99 -
 AC:     242.2  mm     G. Age:  28w 4d        6  %    FL/BPD:     78.7   71 - 87
 FL:      57.3  mm     G. Age:  30w 0d       25  %    FL/AC:      23.7   20 - 24
 HUM:     52.9  mm     G. Age:  30w 6d       60  %
 Est. FW:    2440  gm    2 lb 15 oz      26  %
Gestational Age
 LMP:           30w 3d        Date:  07/02/13                 EDD:   04/08/14
 U/S Today:     29w 1d                                        EDD:   04/17/14
 Best:          30w 3d     Det. By:  LMP  (07/02/13)          EDD:   04/08/14
Anatomy
 Cranium:          Appears normal         Aortic Arch:      Not well visualized
 Fetal Cavum:      Previously seen        Ductal Arch:      Not well visualized
 Ventricles:       Appears normal         Diaphragm:        Previously seen
 Choroid Plexus:   Previously seen        Stomach:          Appears normal, left
                                                            sided
 Cerebellum:       Previously seen        Abdomen:          Appears normal
 Posterior Fossa:  Previously seen        Abdominal Wall:   Previously seen
 Nuchal Fold:      Previously seen        Cord Vessels:     2 vessel cord prev
                                                            seen
 Face:             Orbits and profile     Kidneys:          Appear normal
                   previously seen
 Lips:             Previously seen        Bladder:          Appears normal
 Palate:           Previously seen        Spine:            Previously seen
 Heart:            Appears normal         Lower             Previously seen
                   (4CH, axis, and        Extremities:
                   situs)
 RVOT:             Previously seen        Upper             Previously seen
                                          Extremities:
 LVOT:             Previously seen
 Other:  Female gender previously seen. Technically difficult due to maternal
         habitus and fetal position.
Doppler - Fetal Vessels
 Umbilical Artery
 S/D:   2.9            54  %tile       RI:
 PI:    1.05                           PSV:       38.3    cm/s
 Absent DFV:    No     Reverse DFV:    No
Cervix Uterus Adnexa
 Cervix:       Not visualized (advanced GA >54wks)
Impression
INDICATION: 28 yr old CFOL99L at 13w1d with fetus with single
 umbilical artery for fetal growth.

[Series 1: us ob follow-up · 0.25mm/px · 35 acquisitions, 16 frames shown]
[im 1/35]
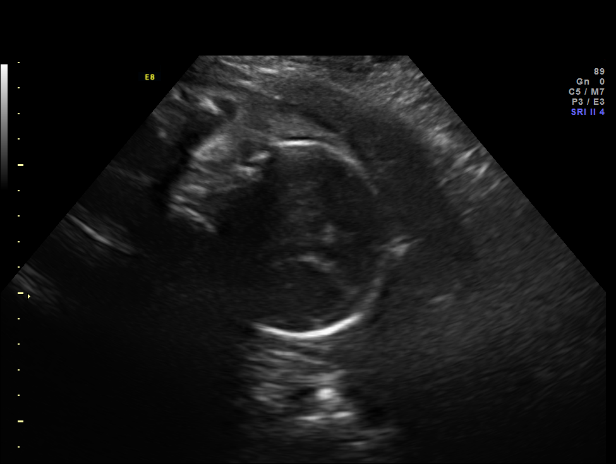
[im 3/35]
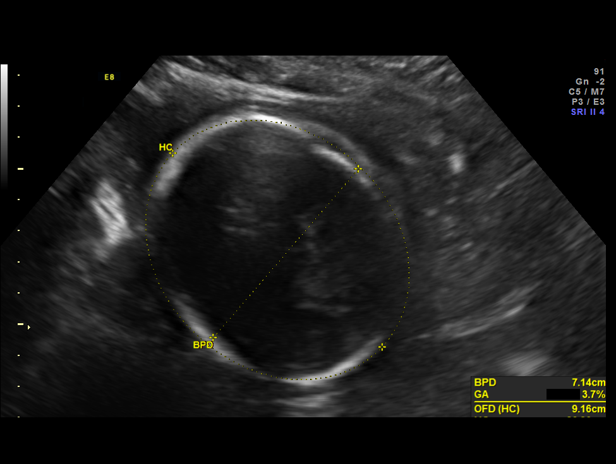
[im 6/35]
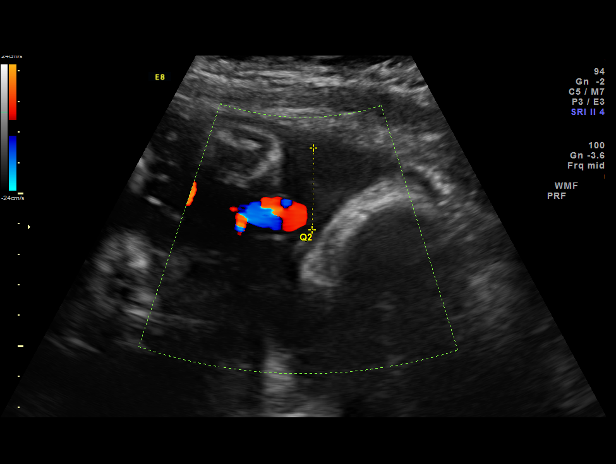
[im 8/35]
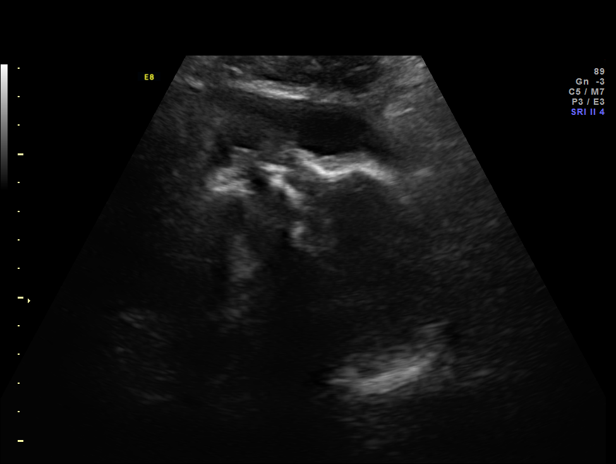
[im 9/35]
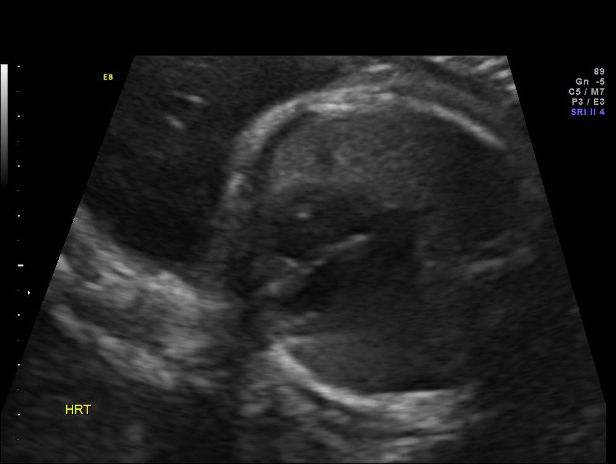
[im 12/35]
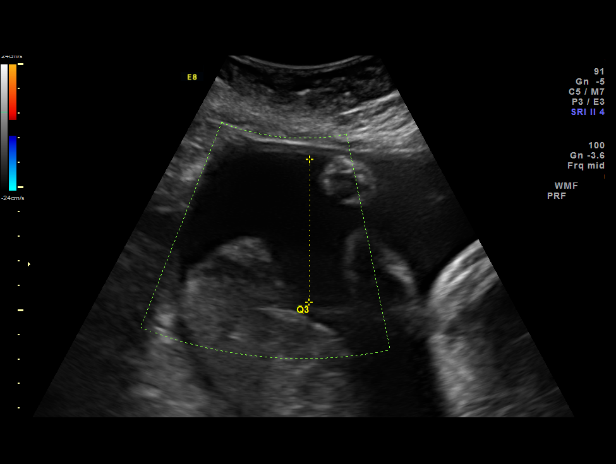
[im 14/35]
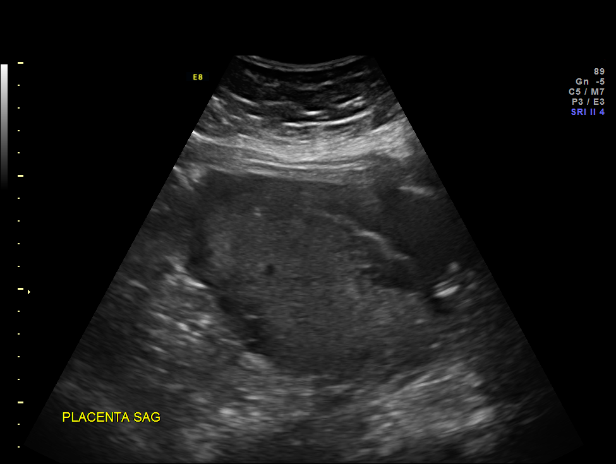
[im 17/35]
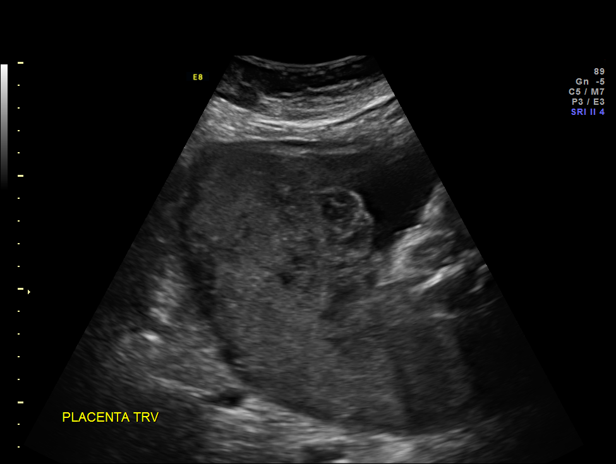
[im 18/35]
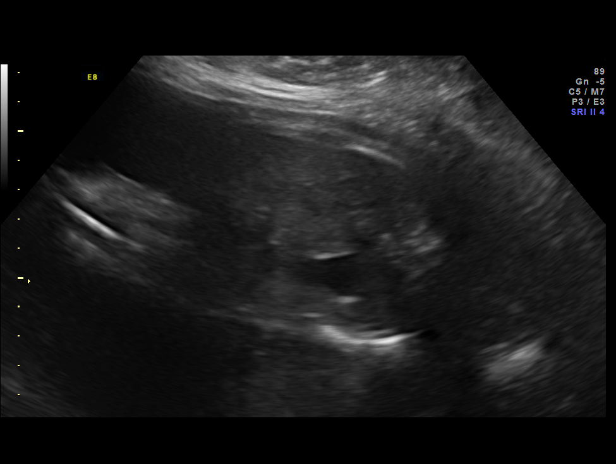
[im 21/35]
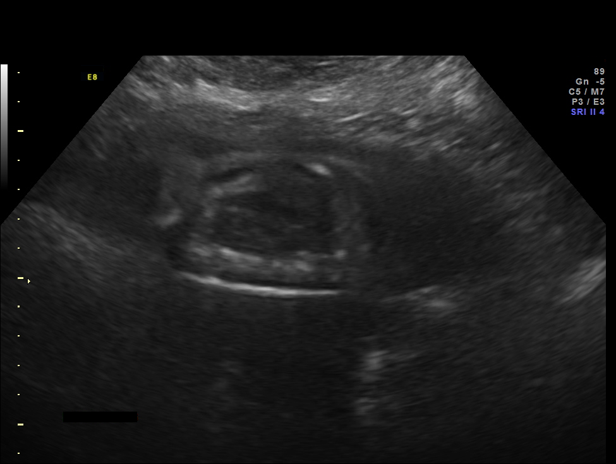
[im 23/35]
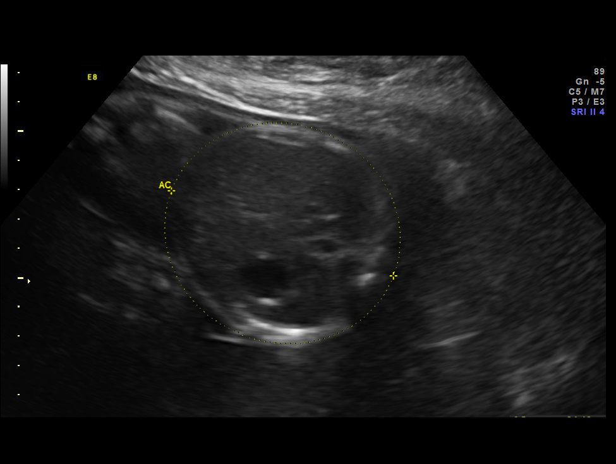
[im 26/35]
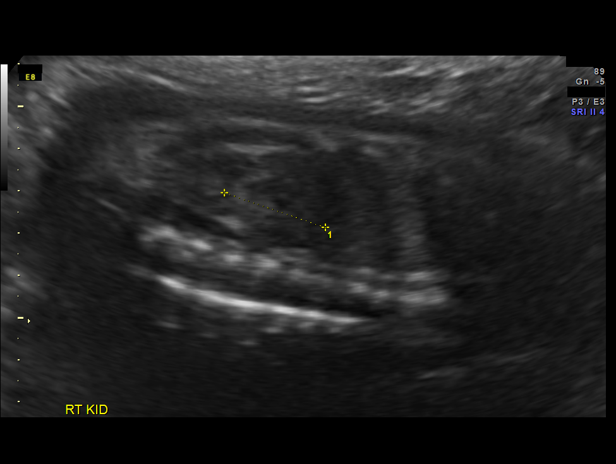
[im 27/35]
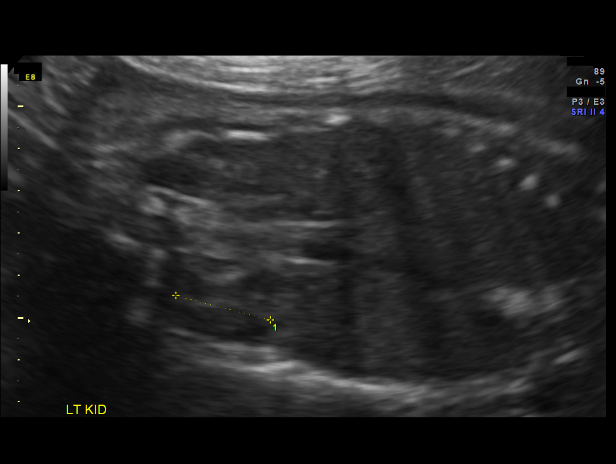
[im 29/35]
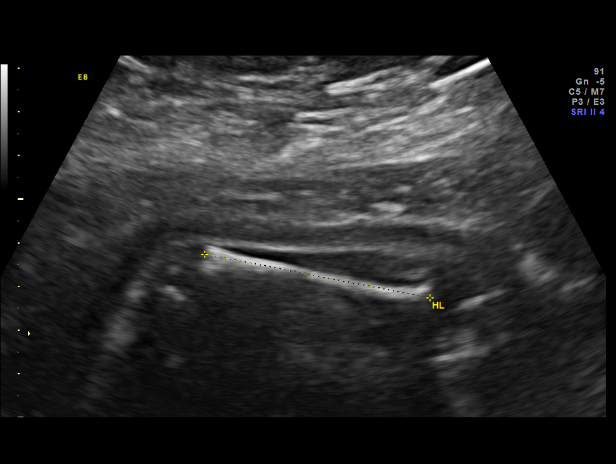
[im 32/35]
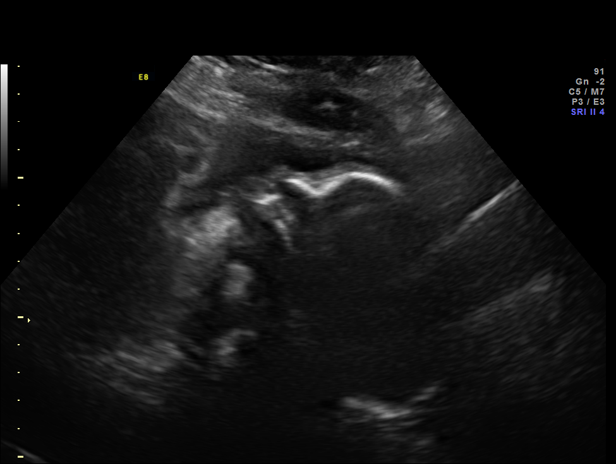
[im 35/35]
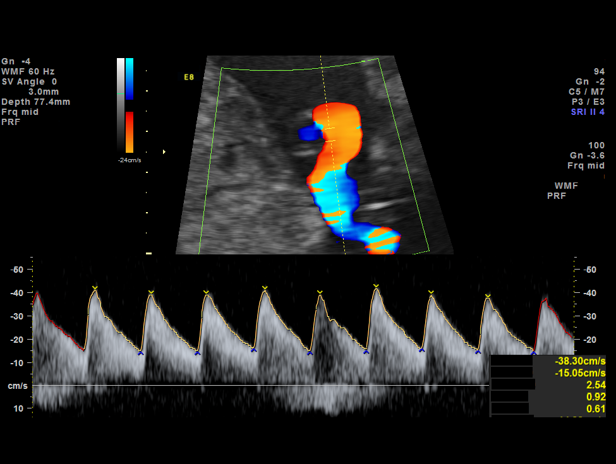

[16 of 28 positions shown; findings below may reference images not displayed]

FINDINGS: 1. Single intrauterine pregnancy.
 2. Estimated fetal weight is in the 26th%; the abdominal
 circumference is in the 6th%.
 3. Posterior placenta without evidence of previa.
 4. Normal amniotic fluid index.
 5. Again seen is a single umbilical artery.
 6. The remainder of the limited anatomy survey is normal.
 7. Normal umbilical artery Doppler studies.
Recommendations

 1. Overall appropriate fetal growth:
 - lagging abdominal circumference- interval growth is
 appropriate; normal Dopplers
 - recommend fetal growth in 3 weeks
 2. Single umbilical artery:
 - previously counseled
 - had normal fetal echocardiogram
 - recommend fetal growth every 3-4 weeks
 3. Patient had mild range blood pressures in our office and
 reports has had some elevated BPs in Dr. [REDACTED]:
 - based on this likely meets criteria for gestational
 hypertension (denies history of chronic hypertension)
 - sent to [facility] for evaluation for preeclampsia
 - if meets criteria for gestational hypertension:
 - recommend initiate antenatal testing
 - recommend course of SADIKOGLU
 - recommend weekly CBC, AST, ALT, BUN, creatinine
 - recommend close surveillance for the development of
 signs/symptoms of severe gestational hypertension or severe
 preeclampsia
 - recommend delivery at 37 weeks or sooner for severe
 diseased or otherwise clinically indicated

 Discussed with Dr. Zivaljic and Dr. Conko and patient sent to
 [facility]

## 2015-06-05 IMAGING — US US OB FOLLOW-UP
1 series · 16 of 28 positions shown · non-contrast
Comparison: none

OBSTETRICS REPORT
                      (Signed Final 02/25/2014 [DATE])

Service(s) Provided
 US OB FOLLOW UP                                       76816.1
Indications
 2 vessel umbilical cord
 Size less than dates
 Maternal morbid obesity (315 lb)
 Normal Fetal ECHO
 History of cesarean delivery, currently pregnant      654.20,
Fetal Evaluation
 Num Of Fetuses:    1
 Fetal Heart Rate:  152                          bpm
 Cardiac Activity:  Observed
 Presentation:      Cephalic
 Placenta:          Posterior, above cervical
                    os
 P. Cord            Previously Visualized
 Insertion:
 Amniotic Fluid
 AFI FV:      Subjectively within normal limits
 AFI Sum:     13.51   cm       45  %Tile     Larg Pckt:       6  cm
 RUQ:   6       cm   RLQ:    1.79   cm    LUQ:   2.52    cm   LLQ:    3.2    cm
Biophysical Evaluation
 Amniotic F.V:   Pocket => 2 cm two         F. Tone:        Observed
                 planes
 F. Movement:    Observed                   Score:          [DATE]
 F. Breathing:   Observed
Biometry
 BPD:     81.6  mm     G. Age:  32w 6d                CI:         81.8   70 - 86
 OFD:     99.8  mm                                    FL/HC:      21.6   19.4 -
 HC:     290.1  mm     G. Age:  31w 6d      < 3  %    HC/AC:      1.02   0.96 -
 AC:     284.9  mm     G. Age:  32w 3d       16  %    FL/BPD:     76.7   71 - 87
 FL:      62.6  mm     G. Age:  32w 3d        9  %    FL/AC:      22.0   20 - 24
 HUM:       58  mm     G. Age:  33w 4d       50  %
 Est. FW:    1574  gm      4 lb 6 oz     30  %
Gestational Age
 LMP:           34w 0d        Date:  07/02/13                 EDD:   04/08/14
 U/S Today:     32w 3d                                        EDD:   04/19/14
 Best:          34w 0d     Det. By:  LMP  (07/02/13)          EDD:   04/08/14
Anatomy
 Cranium:          Appears normal         Aortic Arch:      Not well visualized
 Fetal Cavum:      Previously seen        Ductal Arch:      Not well visualized
 Ventricles:       Appears normal         Diaphragm:        Previously seen
 Choroid Plexus:   Previously seen        Stomach:          Appears normal, left
                                                            sided
 Cerebellum:       Previously seen        Abdomen:          Appears normal
 Posterior Fossa:  Previously seen        Abdominal Wall:   Previously seen
 Nuchal Fold:      Previously seen        Cord Vessels:     2 vessel cord prev
                                                            seen
 Face:             Orbits and profile     Kidneys:          Appear normal
                   previously seen
 Lips:             Previously seen        Bladder:          Appears normal
 Palate:           Previously seen        Spine:            Previously seen
 Heart:            Appears normal         Lower             Previously seen
                   (4CH, axis, and        Extremities:
                   situs)
 RVOT:             Previously seen        Upper             Previously seen
                                          Extremities:
 LVOT:             Previously seen
 Other:  Female gender previously seen. Technically difficult due to maternal
         habitus and fetal position.
Cervix Uterus Adnexa
 Cervix:       Not visualized (advanced GA >36wks)
Impression
INDICATION: 28 yr old YOVXCCX at 52w7d with fetus with single
 umbilical artery, previous finding of lagging growth, and
 possible gestational hypertension for fetal growth and BPP.

[Series 1: us ob follow-up · 0.28mm/px · 32 acquisitions, 16 frames shown]
[im 1/32]
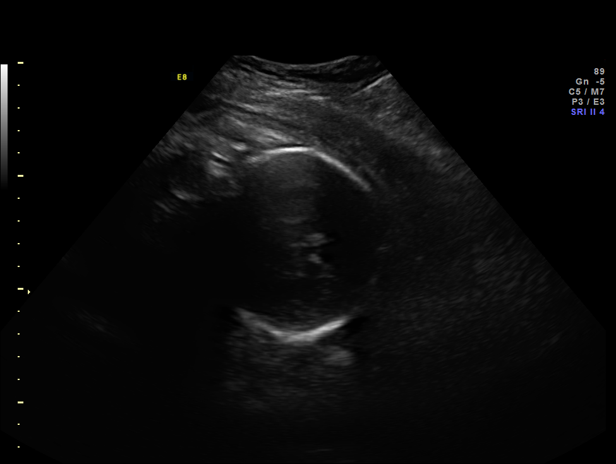
[im 3/32]
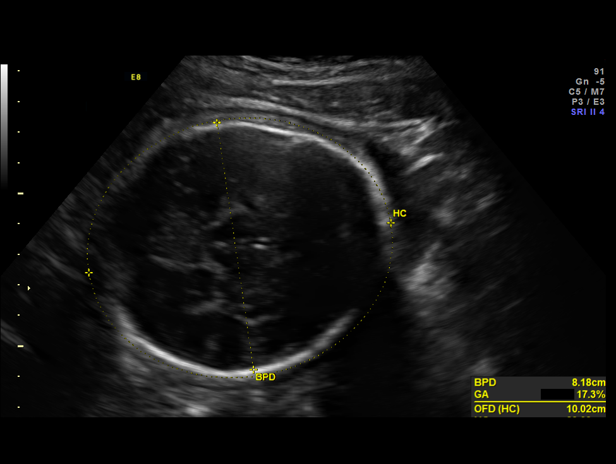
[im 5/32]
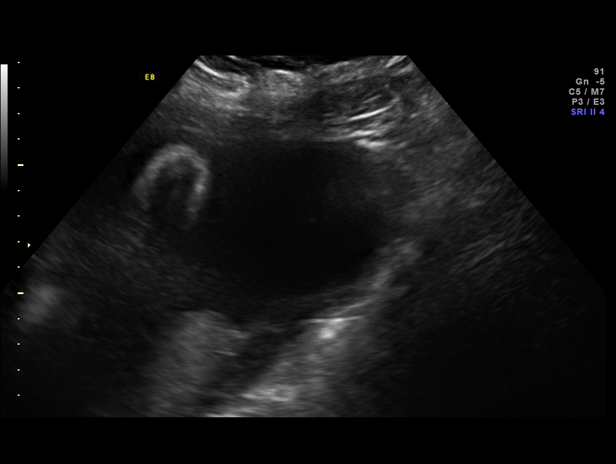
[im 7/32]
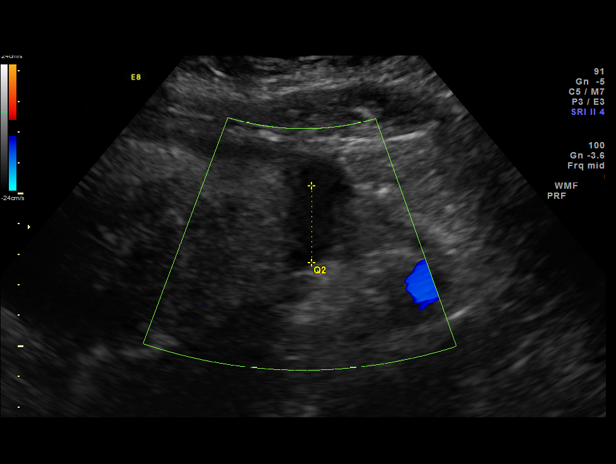
[im 9/32]
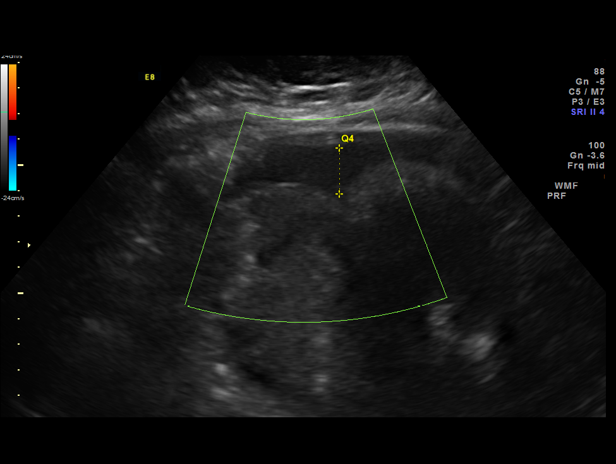
[im 11/32]
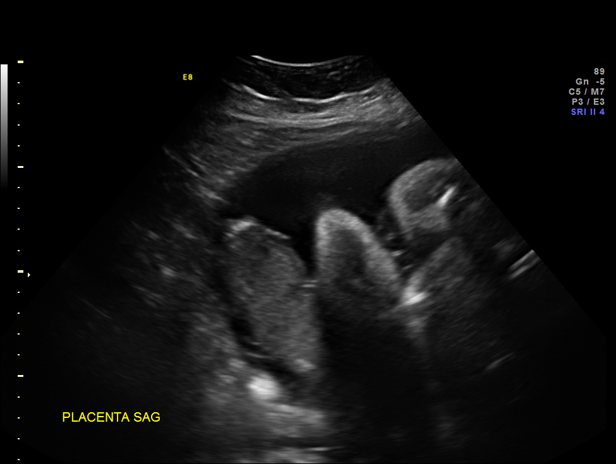
[im 13/32]
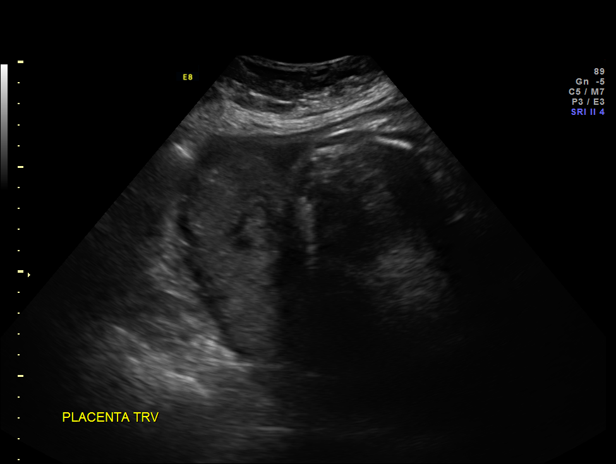
[im 15/32]
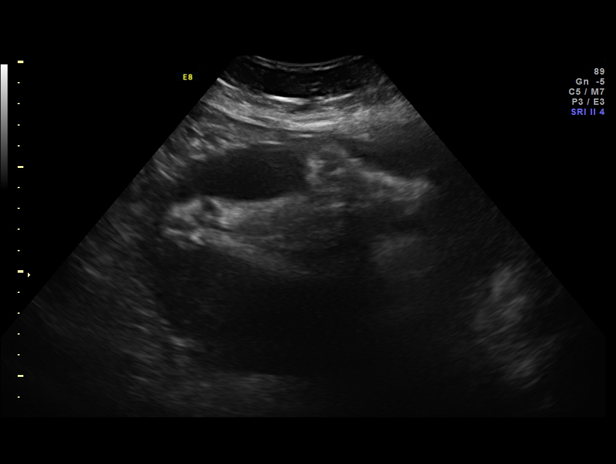
[im 17/32]
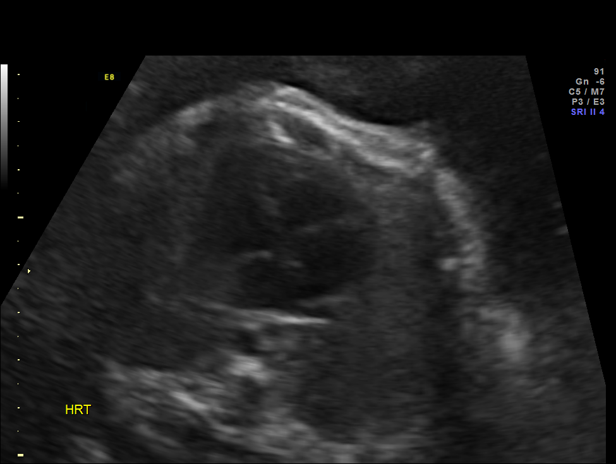
[im 19/32]
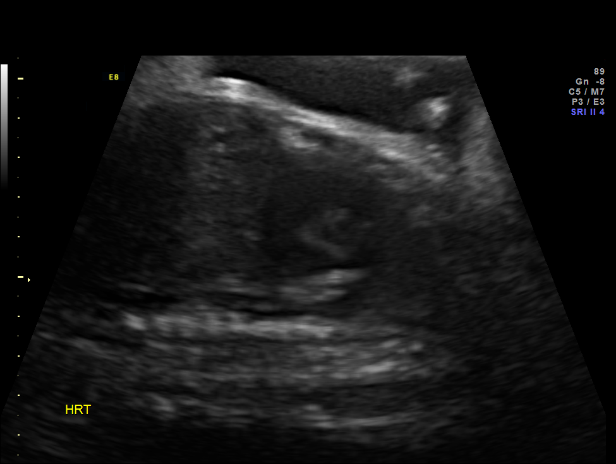
[im 21/32]
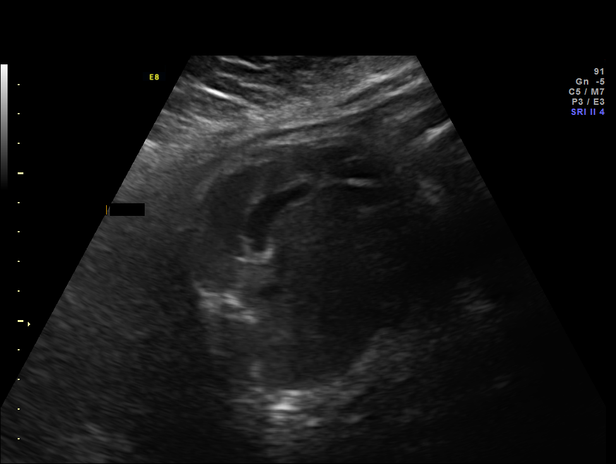
[im 23/32]
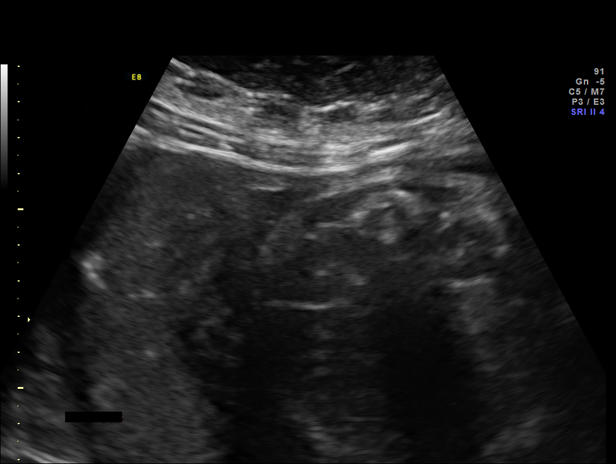
[im 25/32]
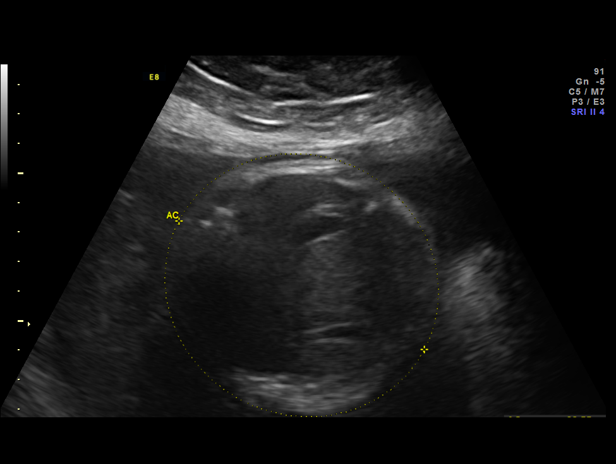
[im 27/32]
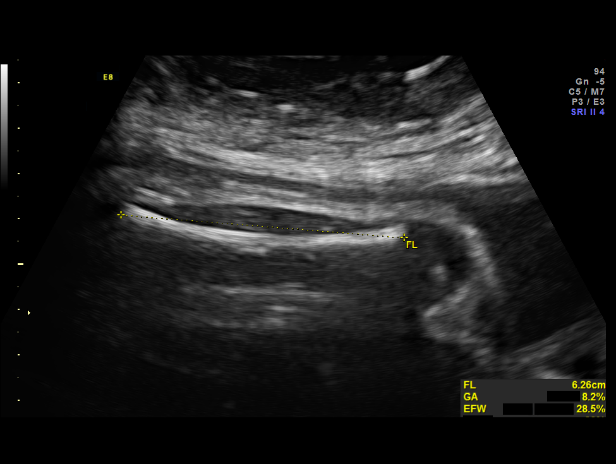
[im 29/32]
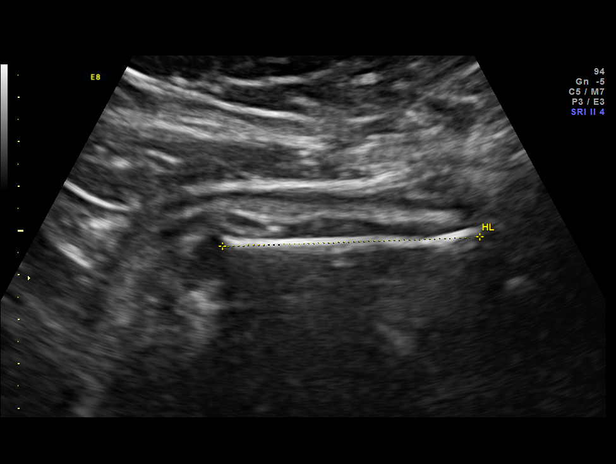
[im 32/32]
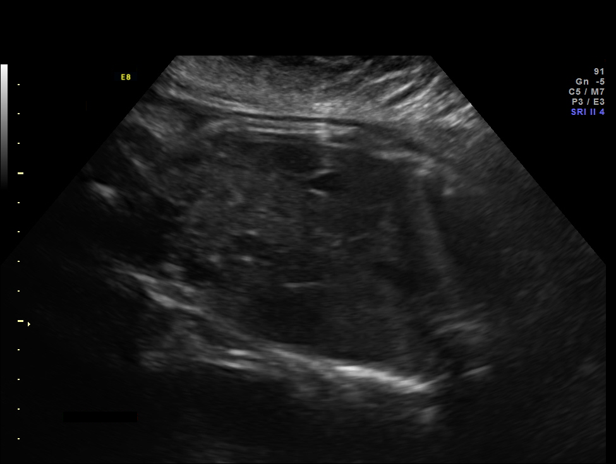

[16 of 28 positions shown; findings below may reference images not displayed]

FINDINGS: 1. Single intrauterine pregnancy.
 2. Estimated fetal weight is in the 30th%.
 3. Posterior placenta without evidence of previa.
 4. Normal amniotic fluid index.
 5. Again seen is a single umbilical artery.
 6. The remainder of the limited anatomy survey is normal.
 7. Normal biophysical profile of [DATE].
Recommendations

 1. Appropriate fetal growth:
 2. Single umbilical artery:
 - previously counseled
 - had normal fetal echocardiogram
 - recommend fetal growth every 3-4 weeks
 3. Patient previously had mild range blood pressures in our
 office and reports has had some elevated BPs in Dr. [REDACTED]:
 - based on this likely meets criteria for gestational
 hypertension (denies history of chronic hypertension-
 although I do not have access to other BPs)
 - sent to [facility] for evaluation for preeclampsia
 - if meets criteria for gestational hypertension:
 - recommend weekly CBC, AST, ALT, BUN, creatinine
 - recommend close surveillance for the development of
 signs/symptoms of severe gestational hypertension or severe
 preeclampsia
 - recommend delivery at 37 weeks or sooner for severe
 diseased or otherwise clinically indicated

 - recommend continue antenatal testing

 Patient desires to transfer to the high risk clinic- assisted in
 this
 BP normal today (although at top end of normal) Patient
 missed appointment with Dr. DONNELL discussed importance of
 keeping prenatal appointments especially with elevated BP.
 Patient has appointment [DATE]

## 2015-06-13 IMAGING — US US FETAL BPP W/O NONSTRESS
1 series · 13 of 15 positions shown · non-contrast
Comparison: none

[Series 1: us fetal bpp w/o nonstress · 0.25mm/px · 15 acquisitions, 13 frames shown]
[im 1/15]
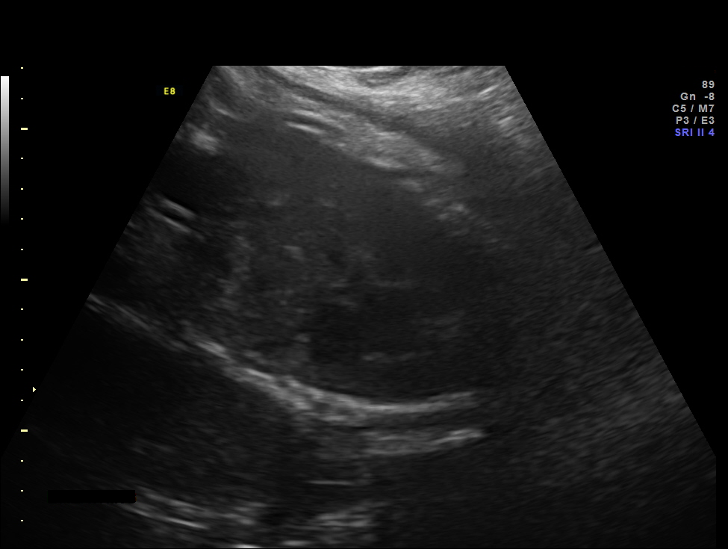
[im 2/15]
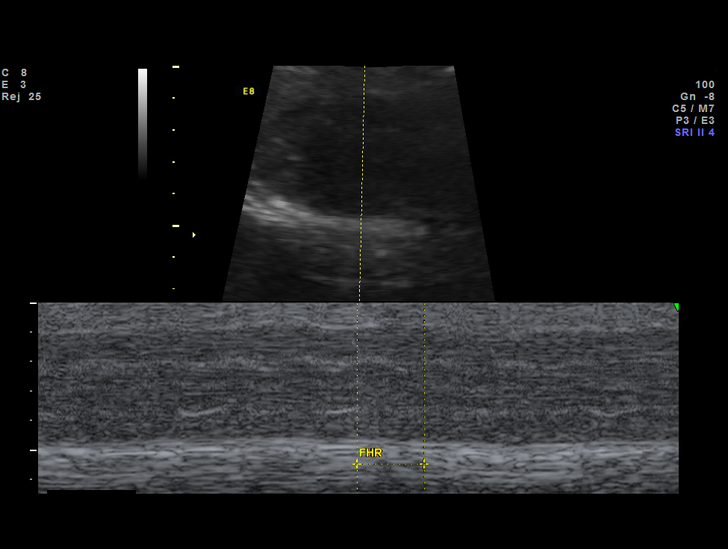
[im 3/15]
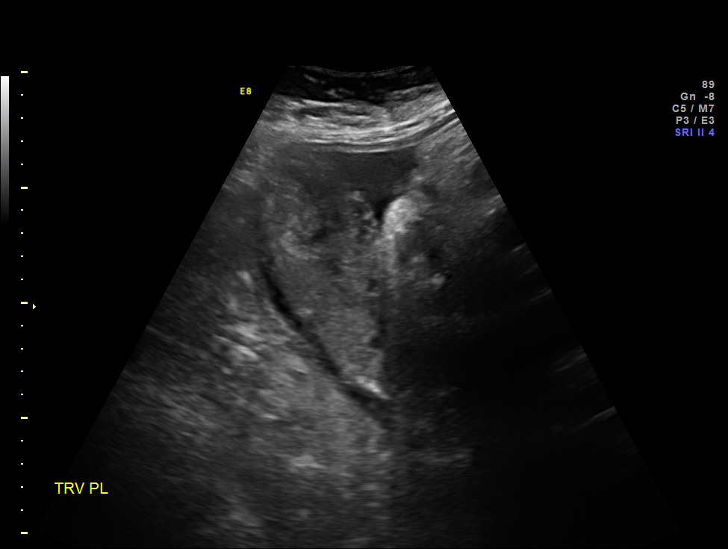
[im 5/15]
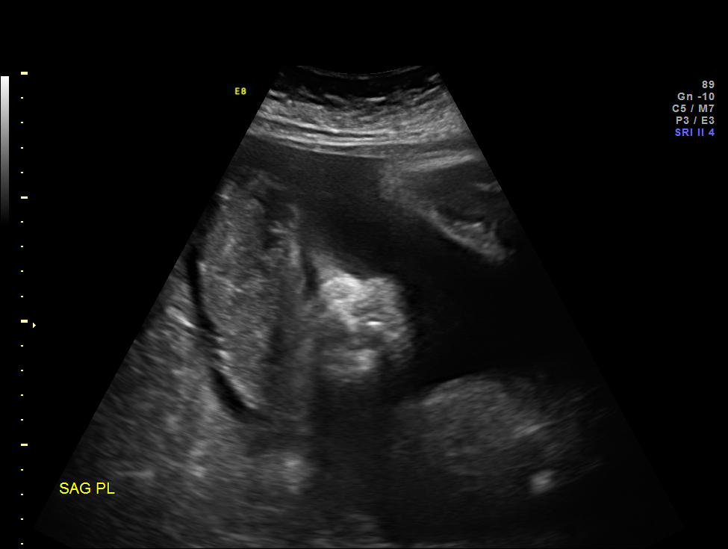
[im 6/15]
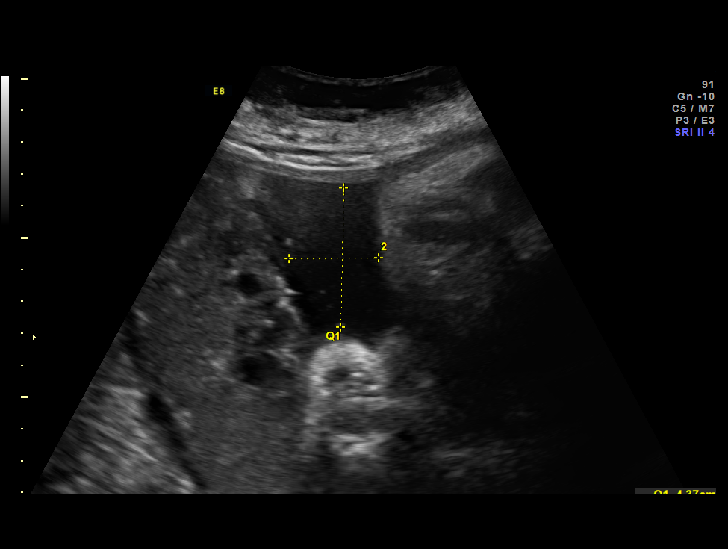
[im 7/15]
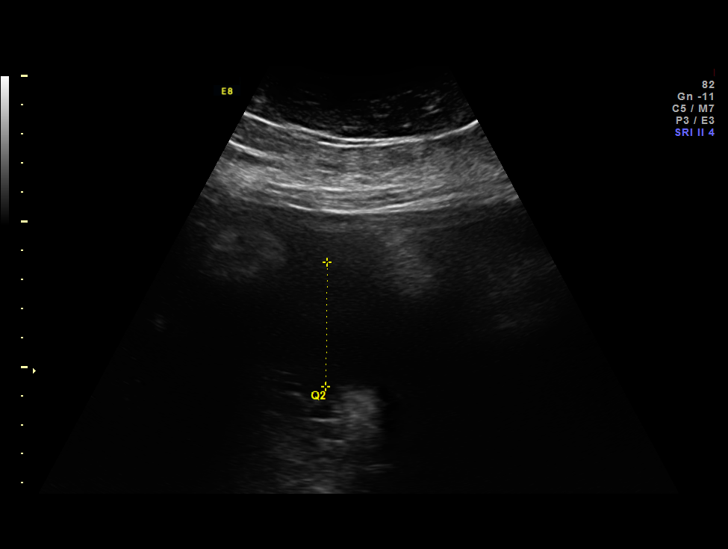
[im 8/15]
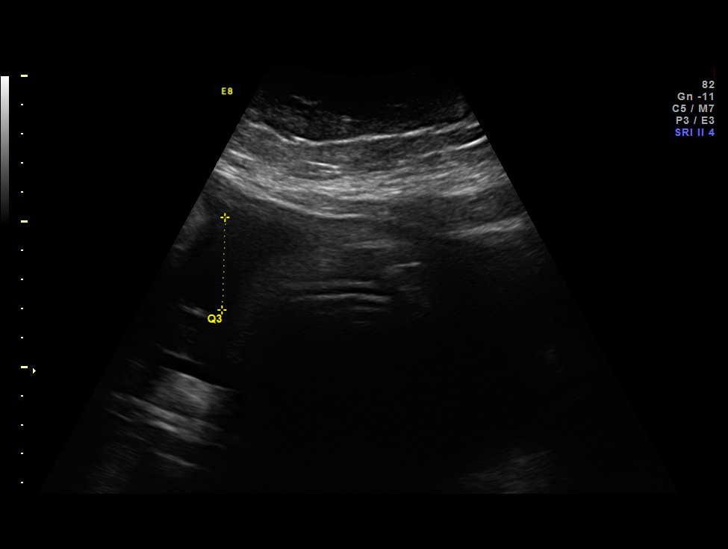
[im 9/15]
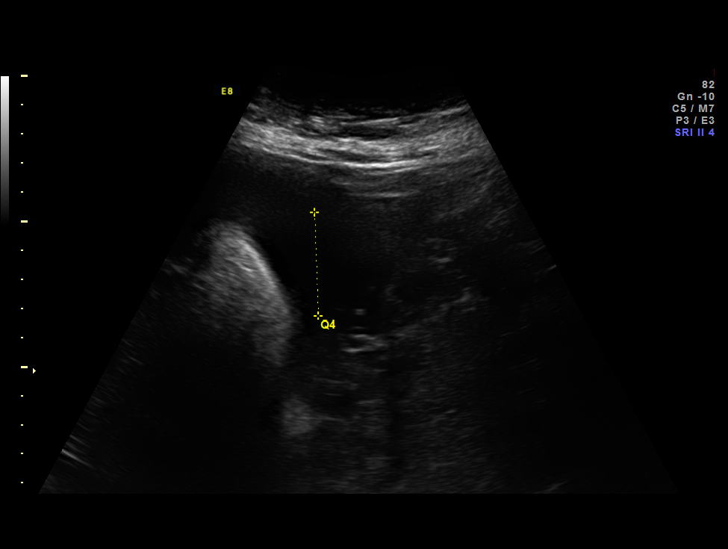
[im 10/15]
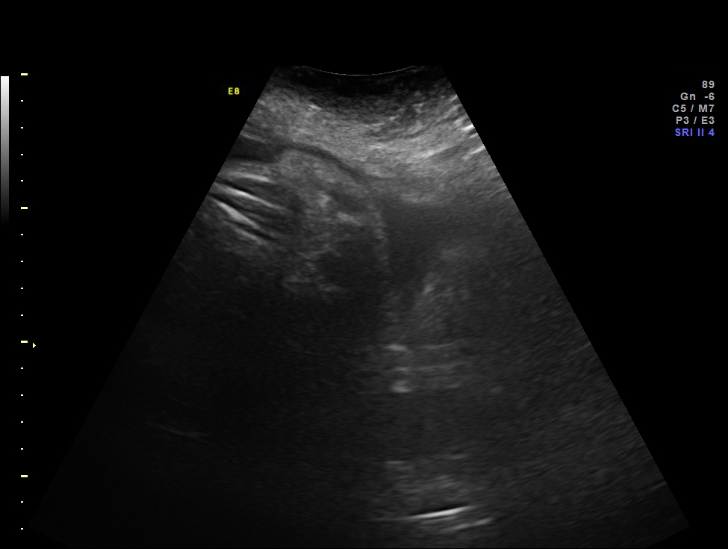
[im 11/15]
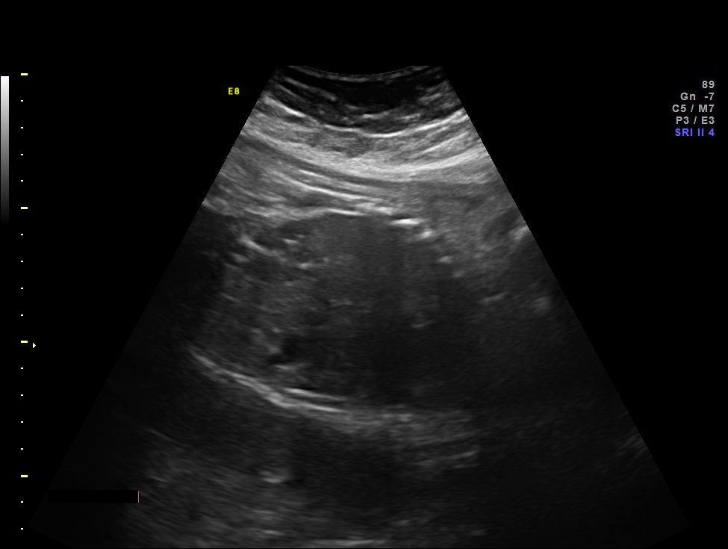
[im 13/15]
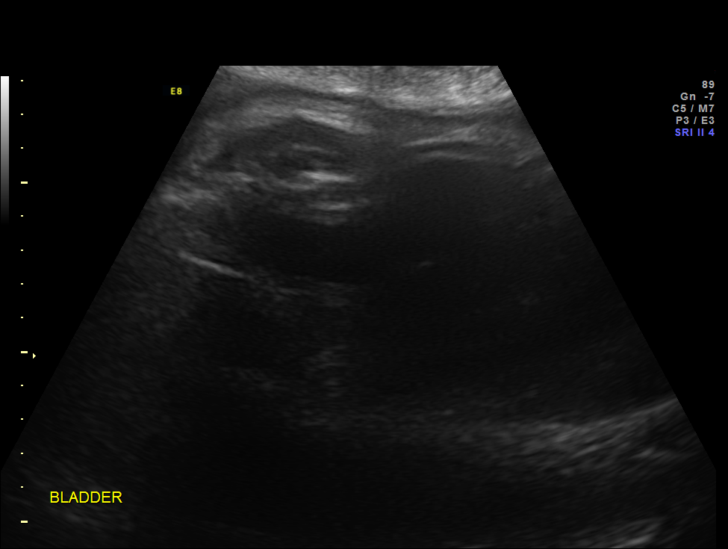
[im 14/15]
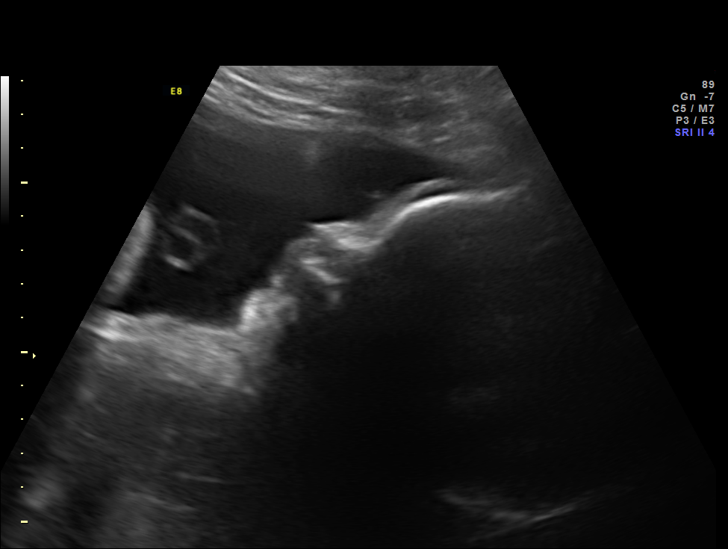
[im 15/15]
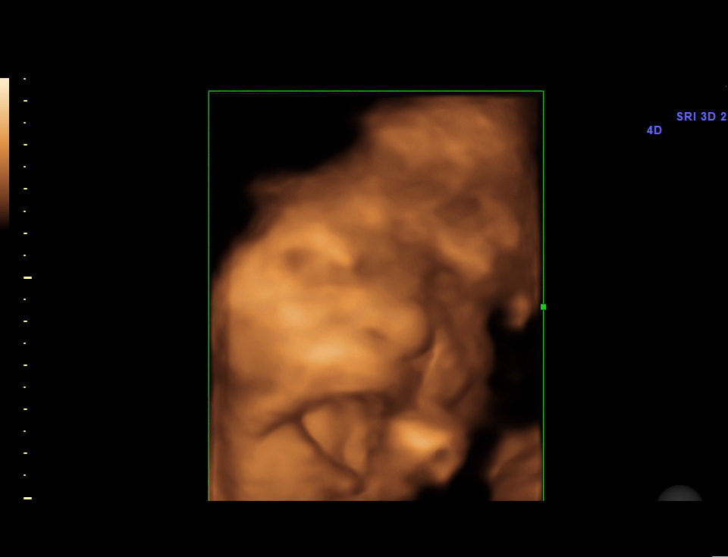

[13 of 15 positions shown; findings below may reference images not displayed]

OBSTETRICS REPORT
                      (Signed Final 03/05/2014 [DATE])

Service(s) Provided

Indications

 2 vessel umbilical cord
 Size less than dates
 Maternal morbid obesity (315 lb)
 Normal Fetal ECHO
 History of cesarean delivery, currently pregnant      654.20,
Fetal Evaluation

 Num Of Fetuses:    1
 Fetal Heart Rate:  150                          bpm
 Cardiac Activity:  Observed
 Presentation:      Cephalic
 Placenta:          Posterior, above cervical
                    os
 P. Cord            Previously Visualized
 Insertion:

 Amniotic Fluid
 AFI FV:      Subjectively within normal limits
 AFI Sum:     15.4    cm       56  %Tile     Larg Pckt:     4.4  cm
 RUQ:   4.4     cm   RLQ:    4.3    cm    LUQ:   3.2     cm   LLQ:    3.5    cm
Biophysical Evaluation

 Amniotic F.V:   Within normal limits       F. Tone:        Observed
 F. Movement:    Observed                   Score:          [DATE]
 F. Breathing:   Observed
Gestational Age

 LMP:           35w 1d        Date:  07/02/13                 EDD:   04/08/14
 Best:          35w 1d     Det. By:  LMP  (07/02/13)          EDD:   04/08/14
Cervix Uterus Adnexa

 Cervix:       Not visualized (advanced GA >54wks)
Impression

 Single IUP at 35w 1d
 Single umbilical artery, gestational hypertension
 Active fetus with BPP of [DATE]
 Normal amniotic fluid volume
Recommendations

 Recommend continued 2x weekly NSTs with weekly AFIs.
 If the patient meets criteria for gestational hypertension,
 recommend delivery at 37 weeks gestation
 Follow up ultrasounds as clinically indicated

## 2017-03-11 ENCOUNTER — Encounter (HOSPITAL_BASED_OUTPATIENT_CLINIC_OR_DEPARTMENT_OTHER): Payer: Self-pay | Admitting: Emergency Medicine

## 2017-03-11 ENCOUNTER — Emergency Department (HOSPITAL_BASED_OUTPATIENT_CLINIC_OR_DEPARTMENT_OTHER): Payer: Medicaid Other

## 2017-03-11 ENCOUNTER — Emergency Department (HOSPITAL_BASED_OUTPATIENT_CLINIC_OR_DEPARTMENT_OTHER)
Admission: EM | Admit: 2017-03-11 | Discharge: 2017-03-11 | Disposition: A | Discharge status: Home / Self Care | Payer: Medicaid Other | Attending: Emergency Medicine | Admitting: Emergency Medicine

## 2017-03-11 DIAGNOSIS — O200 Threatened abortion: Secondary | ICD-10-CM | POA: Diagnosis not present

## 2017-03-11 DIAGNOSIS — R1031 Right lower quadrant pain: Secondary | ICD-10-CM | POA: Insufficient documentation

## 2017-03-11 DIAGNOSIS — O209 Hemorrhage in early pregnancy, unspecified: Secondary | ICD-10-CM | POA: Diagnosis present

## 2017-03-11 DIAGNOSIS — O208 Other hemorrhage in early pregnancy: Secondary | ICD-10-CM | POA: Insufficient documentation

## 2017-03-11 DIAGNOSIS — Z87891 Personal history of nicotine dependence: Secondary | ICD-10-CM | POA: Diagnosis not present

## 2017-03-11 DIAGNOSIS — O468X1 Other antepartum hemorrhage, first trimester: Secondary | ICD-10-CM

## 2017-03-11 DIAGNOSIS — IMO0001 Reserved for inherently not codable concepts without codable children: Secondary | ICD-10-CM

## 2017-03-11 DIAGNOSIS — R109 Unspecified abdominal pain: Secondary | ICD-10-CM

## 2017-03-11 DIAGNOSIS — Z3A10 10 weeks gestation of pregnancy: Secondary | ICD-10-CM | POA: Diagnosis not present

## 2017-03-11 DIAGNOSIS — O418X10 Other specified disorders of amniotic fluid and membranes, first trimester, not applicable or unspecified: Secondary | ICD-10-CM

## 2017-03-11 DIAGNOSIS — Z79899 Other long term (current) drug therapy: Secondary | ICD-10-CM | POA: Insufficient documentation

## 2017-03-11 LAB — CBC WITH DIFFERENTIAL/PLATELET
BASOS PCT: 0 %
Basophils Absolute: 0 10*3/uL (ref 0.0–0.1)
EOS PCT: 1 %
Eosinophils Absolute: 0.1 10*3/uL (ref 0.0–0.7)
HCT: 34.8 % — ABNORMAL LOW (ref 36.0–46.0)
Hemoglobin: 11.5 g/dL — ABNORMAL LOW (ref 12.0–15.0)
LYMPHS ABS: 2.7 10*3/uL (ref 0.7–4.0)
Lymphocytes Relative: 28 %
MCH: 23.6 pg — AB (ref 26.0–34.0)
MCHC: 33 g/dL (ref 30.0–36.0)
MCV: 71.3 fL — AB (ref 78.0–100.0)
MONO ABS: 0.8 10*3/uL (ref 0.1–1.0)
Monocytes Relative: 8 %
Neutro Abs: 5.9 10*3/uL (ref 1.7–7.7)
Neutrophils Relative %: 63 %
PLATELETS: 253 10*3/uL (ref 150–400)
RBC: 4.88 MIL/uL (ref 3.87–5.11)
RDW: 14.5 % (ref 11.5–15.5)
WBC: 9.5 10*3/uL (ref 4.0–10.5)

## 2017-03-11 LAB — URINALYSIS, ROUTINE W REFLEX MICROSCOPIC
BILIRUBIN URINE: NEGATIVE
GLUCOSE, UA: NEGATIVE mg/dL
HGB URINE DIPSTICK: NEGATIVE
Ketones, ur: NEGATIVE mg/dL
Leukocytes, UA: NEGATIVE
Nitrite: NEGATIVE
PH: 6.5 (ref 5.0–8.0)
Protein, ur: NEGATIVE mg/dL
SPECIFIC GRAVITY, URINE: 1.017 (ref 1.005–1.030)

## 2017-03-11 LAB — COMPREHENSIVE METABOLIC PANEL
ALT: 18 U/L (ref 14–54)
AST: 19 U/L (ref 15–41)
Albumin: 3.8 g/dL (ref 3.5–5.0)
Alkaline Phosphatase: 44 U/L (ref 38–126)
Anion gap: 6 (ref 5–15)
BUN: 9 mg/dL (ref 6–20)
CHLORIDE: 102 mmol/L (ref 101–111)
CO2: 27 mmol/L (ref 22–32)
Calcium: 9.3 mg/dL (ref 8.9–10.3)
Creatinine, Ser: 0.82 mg/dL (ref 0.44–1.00)
Glucose, Bld: 95 mg/dL (ref 65–99)
Potassium: 4 mmol/L (ref 3.5–5.1)
Sodium: 135 mmol/L (ref 135–145)
TOTAL PROTEIN: 7.4 g/dL (ref 6.5–8.1)
Total Bilirubin: 0.5 mg/dL (ref 0.3–1.2)

## 2017-03-11 LAB — HCG, QUANTITATIVE, PREGNANCY: HCG, BETA CHAIN, QUANT, S: 48096 m[IU]/mL — AB (ref <5)

## 2017-03-11 MED ORDER — PRENATAL 27-0.8 MG PO TABS: 1.0000 | ORAL_TABLET | Freq: Every day | ORAL | 0 refills | Status: DC

## 2017-03-11 MED ORDER — ACETAMINOPHEN 500 MG PO TABS
1000.0000 mg | ORAL_TABLET | Freq: Once | ORAL | Status: AC
  Administered 2017-03-11: 1000 mg via ORAL
  Filled 2017-03-11: qty 2

## 2017-03-11 MED ORDER — PYRIDOXINE HCL 100 MG/ML IJ SOLN
100.0000 mg | Freq: Once | INTRAMUSCULAR | Status: AC
  Administered 2017-03-11: 100 mg via INTRAVENOUS
  Filled 2017-03-11: qty 1

## 2017-03-11 NOTE — ED Triage Notes (Signed)
Pt is approximately [redacted] weeks pregnant. Reports SOB with exertion x 2 weeks and vaginal bleeding today with L side abd  And back pain. [facility].

## 2017-03-11 NOTE — ED Notes (Signed)
Pt reports hurting in the low back, flanks, and vaginal area. Suprapubic tenderness present.

## 2017-03-11 NOTE — ED Notes (Signed)
Patient transported to Ultrasound 

## 2017-03-11 NOTE — ED Provider Notes (Signed)
[facility] Note   CSN: [id number] Arrival date & time: 03/11/17  1803  By signing my name below, I, Diona Browner, attest that this documentation has been prepared under the direction and in the presence of Alvira Monday, MD. Electronically Signed: Diona Browner, ED Scribe. 03/11/17. 7:19 PM.  History   Chief Complaint Chief Complaint  Patient presents with  . 10 weeks preg/bleeding  . Shortness of Breath    HPI Sara Keith is a 31 y.o. female who presents to the Emergency Department complaining of gradually improving, vaginal bleeding that started ~ 6:30-7 am today. Pt is ~ [redacted] weeks pregnant by LMP.  Reports initially noted bleeding, placed pad and soaked in 3hr, and since then bleeding has lightened. Associated sx include right sided abdominal pain, back pain, intermittent, 7/10, HA, and light headedness that was present while she was waiting at [facility] but not currently. LNMP was in March 2018. Pt denies fever, constipation, dysuria and vaginal discharge.   After initial hx prior to discharge, also reports dyspnea, present since she found out she was pregnant. Notices feeling more tired and short of breath with exertion, leaning over.  Denies leg pain or swelling but reports right foot pain for last month that she notices at the end of the day after she has been working the whole day.   The history is provided by the patient. No language interpreter was used.    Past Medical History:  Diagnosis Date  . Gestational diabetes   . Pregnancy induced hypertension     Patient Active Problem List   Diagnosis Date Noted  . Encounter for postpartum visit 05/07/2014  . Abscess of right thigh 03/29/2014  . Maternal morbid obesity with BMI over 50, antepartum 03/29/2014  . S/P cesarean section 03/29/2014  . Other specified indication for care or intervention related to labor and delivery, unspecified as to episode of care 03/28/2014  . Previous cesarean  delivery, antepartum condition or complication 03/20/2014  . Two vessel umbilical cord, antepartum 03/20/2014  . Gestational diabetes mellitus, antepartum 03/18/2014  . Gestational hypertension without significant proteinuria in third trimester 03/03/2014    Past Surgical History:  Procedure Laterality Date  . CESAREAN SECTION    . CESAREAN SECTION N/A 03/29/2014   Procedure: CESAREAN SECTION;  Surgeon: Tereso Newcomer, MD;  Location: [facility];  Service: Obstetrics;  Laterality: N/A;  . sweat glan      OB History    Gravida Para Term Preterm AB Living   3 2 2     2    SAB TAB Ectopic Multiple Live Births           2       Home Medications    Prior to Admission medications   Medication Sig Start Date End Date Taking? Authorizing Provider  Prenatal Vit-Fe Fumarate-FA (MULTIVITAMIN-PRENATAL) 27-0.8 MG TABS tablet Take 1 tablet by mouth daily at 12 noon. 03/11/17   Alvira Monday, MD  Prenatal Vit-Min-FA-Fish Oil ([facility] PRENATAL GUMMY PO) Take 1 tablet by mouth 3 (three) times daily.    [provider]    Family History Family History  Problem Relation Age of Onset  . Kidney disease Mother   . Diabetes Father     Social History Social History  Substance Use Topics  . Smoking status: Former Smoker    Types: Cigarettes    Quit date: 07/03/2013  . Smokeless tobacco: Never Used  . Alcohol use Yes     Comment: occasionally  Allergies   Patient has no known allergies.   Review of Systems Review of Systems  Constitutional: Negative for appetite change and fever.  HENT: Negative for sore throat.   Eyes: Negative for visual disturbance.  Respiratory: Positive for shortness of breath. Negative for cough.   Cardiovascular: Negative for chest pain.  Gastrointestinal: Positive for abdominal pain. Negative for constipation, nausea and vomiting.  Genitourinary: Positive for vaginal bleeding. Negative for difficulty urinating, dysuria and vaginal discharge.    Musculoskeletal: Positive for back pain. Negative for neck pain.  Skin: Negative for rash.  Neurological: Positive for light-headedness and headaches. Negative for syncope.     Physical Exam Updated Vital Signs BP 134/76   Pulse 71   Temp 98 F (36.7 C) (Oral)   Resp 20   LMP 12/26/2016   SpO2 100%   Physical Exam  Constitutional: She is oriented to person, place, and time. She appears well-developed and well-nourished. No distress.  HENT:  Head: Normocephalic and atraumatic.  Eyes: Conjunctivae and EOM are normal.  Neck: Normal range of motion.  Cardiovascular: Normal rate, regular rhythm, normal heart sounds and intact distal pulses.  Exam reveals no gallop and no friction rub.   No murmur heard. Pulmonary/Chest: Effort normal and breath sounds normal. No respiratory distress. She has no wheezes. She has no rales.  Abdominal: Soft. Bowel sounds are normal. She exhibits no distension. There is no tenderness. There is no guarding.  Musculoskeletal: She exhibits no edema or tenderness.  Neurological: She is alert and oriented to person, place, and time.  Skin: Skin is warm and dry. No rash noted. She is not diaphoretic. No erythema.  Nursing note and vitals reviewed.    ED Treatments / Results  DIAGNOSTIC STUDIES: Oxygen Saturation is 100% on RA, normal by my interpretation.   COORDINATION OF CARE: 7:19 PM-Discussed next steps with pt. Pt verbalized understanding and is agreeable with the plan.   Labs (all labs ordered are listed, but only abnormal results are displayed) Labs Reviewed  CBC WITH DIFFERENTIAL/PLATELET - Abnormal; Notable for the following:       Result Value   Hemoglobin 11.5 (*)    HCT 34.8 (*)    MCV 71.3 (*)    MCH 23.6 (*)    All other components within normal limits  HCG, QUANTITATIVE, PREGNANCY - Abnormal; Notable for the following:    hCG, Beta Chain, Quant, S 48,096 (*)    All other components within normal limits  URINALYSIS, ROUTINE W  REFLEX MICROSCOPIC  COMPREHENSIVE METABOLIC PANEL    EKG  EKG Interpretation None       Radiology US Ob Transvaginal  Result Date: 03/11/2017 CLINICAL DATA:  Right lower quadrant and back pain with vaginal bleeding in the first trimester pregnancy. EXAM: TRANSVAGINAL OB ULTRASOUND TECHNIQUE: Transvaginal ultrasound was performed for complete evaluation of the gestation as well as the maternal uterus, adnexal regions, and pelvic cul-de-sac. COMPARISON:  None. FINDINGS: Intrauterine gestational sac: Single Yolk sac:  Visualized. Embryo:  Visualized. Cardiac Activity: Visualized. Heart Rate: 127 bpm CRL:   9.3  mm   7 w 0  d                  [place] EDC: 10/28/2017 Subchorionic hemorrhage: Adjacent 1.3 x 0.8 x 2.3 cm subchorionic focus of hemorrhage along the inferior aspect of the gestational sac. Maternal uterus/adnexae: Normal ovaries. IMPRESSION: Moderate size subchorionic hemorrhage measuring 1.3 x 0.8 x 2.3 cm along the inferior aspect of the gestational sac.  Single live 7 week 0 day intrauterine gestation. Electronically Signed   By: Tollie Ethavid  Kwon M.D.   On: 03/11/2017 20:55    Procedures Procedures (including critical care time)  Medications Ordered in ED Medications  acetaminophen (TYLENOL) tablet 1,000 mg (1,000 mg Oral Given 03/11/17 2043)  pyridOXINE (B-6) injection 100 mg (100 mg Intravenous Given 03/11/17 2043)     Initial Impression / Assessment and Plan / ED Course  I have reviewed the triage vital signs and the nursing notes.  Pertinent labs & imaging results that were available during my care of the patient were reviewed by me and considered in my medical decision making (see chart for details).     31yo female G3P2002 at [redacted]wk pregnant by LMP presents with RLQ abdominal pain and vaginal bleeding.  US shows 7wk intrauterine pregnancy with subchorionic hemorrhage. Pt blood type A positive.  Doubt appendicitis/cholecystitis by hx and exam. No significant hemorrhage by history and  hgb 11.5.  No sign of UTI.   Recommend pelvic rest and discussed threatened miscarriage.  Prior to discharge, pt also discussed dyspnea on exertion. No asymmetric leg swelling, however reports right foot pain over the last month. Discussed recommendation for DVT US to ensure no sign of DVT however she declines. Agree that DVT very low possibility given hx of pain in foot worse after work, no swelling. Overall, have low suspicion for PE given no hypoxia, no chest pain, no asymmetric leg swelling, no tachycardia, and mild exertional symptoms. Feel symptoms are likely due to pregnancy hormones in first trimester.  Discussed strict return precautions, need for PCP follow up.  Gave rx for prenatal vitamins. Patient discharged in stable condition with understanding of reasons to return.   Final Clinical Impressions(s) / ED Diagnoses   Final diagnoses:  Abdominal pain  Subchorionic hemorrhage of placenta in first trimester, single or unspecified fetus  Threatened miscarriage    New Prescriptions Discharge Medication List as of 03/11/2017  9:44 PM    I personally performed the services described in this documentation, which was scribed in my presence. The recorded information has been reviewed and is accurate.     Alvira MondaySchlossman, Cass Edinger, MD 03/12/17 [phone]

## 2018-09-23 ENCOUNTER — Encounter (HOSPITAL_BASED_OUTPATIENT_CLINIC_OR_DEPARTMENT_OTHER): Payer: Self-pay | Admitting: Emergency Medicine

## 2018-09-23 ENCOUNTER — Other Ambulatory Visit: Payer: Self-pay

## 2018-09-23 ENCOUNTER — Emergency Department (HOSPITAL_BASED_OUTPATIENT_CLINIC_OR_DEPARTMENT_OTHER)
Admission: EM | Admit: 2018-09-23 | Discharge: 2018-09-23 | Disposition: A | Discharge status: Home / Self Care | Payer: Medicaid Other | Attending: Emergency Medicine | Admitting: Emergency Medicine

## 2018-09-23 DIAGNOSIS — R05 Cough: Secondary | ICD-10-CM | POA: Diagnosis present

## 2018-09-23 DIAGNOSIS — J111 Influenza due to unidentified influenza virus with other respiratory manifestations: Secondary | ICD-10-CM | POA: Insufficient documentation

## 2018-09-23 DIAGNOSIS — Z87891 Personal history of nicotine dependence: Secondary | ICD-10-CM | POA: Insufficient documentation

## 2018-09-23 DIAGNOSIS — R69 Illness, unspecified: Secondary | ICD-10-CM

## 2018-09-23 MED ORDER — ACETAMINOPHEN 325 MG PO TABS: 650.0000 mg | ORAL_TABLET | Freq: Four times a day (QID) | ORAL | 0 refills | Status: DC | PRN

## 2018-09-23 MED ORDER — CETIRIZINE HCL 5 MG PO TABS: 5.0000 mg | ORAL_TABLET | Freq: Every day | ORAL | 0 refills | Status: DC

## 2018-09-23 MED ORDER — FLUTICASONE PROPIONATE 50 MCG/ACT NA SUSP: 1.0000 | Freq: Every day | NASAL | 2 refills | Status: DC

## 2018-09-23 MED ORDER — BENZONATATE 100 MG PO CAPS: 200.0000 mg | ORAL_CAPSULE | Freq: Three times a day (TID) | ORAL | 0 refills | Status: DC

## 2018-09-23 MED ORDER — IBUPROFEN 800 MG PO TABS
800.0000 mg | ORAL_TABLET | Freq: Once | ORAL | Status: AC
  Administered 2018-09-23: 800 mg via ORAL
  Filled 2018-09-23: qty 1

## 2018-09-23 NOTE — ED Triage Notes (Signed)
Pt here with daughter for fever and cough x 1week.

## 2018-09-23 NOTE — Discharge Instructions (Signed)
Return to ED for worsening symptoms, trouble breathing or trouble swallowing, chest pain, vomiting or coughing up blood. °

## 2018-09-23 NOTE — ED Provider Notes (Signed)
[facility] EMERGENCY DEPARTMENT Provider Note   CSN: [id number] Arrival date & time: 09/23/18  1050     History   Chief Complaint Chief Complaint  Patient presents with  . Cough  . Fever    HPI Sara Keith is a 32 y.o. female who presents to ED for evaluation of 1-1/2-day history of fever, chills, rhinorrhea and cough.  She has had a dry cough since the symptoms began.  She notes that her daughter has similar symptoms as well.  She has been taking over-the-counter cold and cough medication with no improvement in symptoms.  Did not receive her influenza vaccine this year.  Denies any shortness of breath, chest pain, wheezing, recent travel.  HPI  Past Medical History:  Diagnosis Date  . Gestational diabetes   . Pregnancy induced hypertension     Patient Active Problem List   Diagnosis Date Noted  . Encounter for postpartum visit 05/07/2014  . Abscess of right thigh 03/29/2014  . Maternal morbid obesity with BMI over 50, antepartum 03/29/2014  . S/P cesarean section 03/29/2014  . Other specified indication for care or intervention related to labor and delivery, unspecified as to episode of care 03/28/2014  . Previous cesarean delivery, antepartum condition or complication 03/20/2014  . Two vessel umbilical cord, antepartum 03/20/2014  . Gestational diabetes mellitus, antepartum 03/18/2014  . Gestational hypertension without significant proteinuria in third trimester 03/03/2014    Past Surgical History:  Procedure Laterality Date  . CESAREAN SECTION    . CESAREAN SECTION N/A 03/29/2014   Procedure: CESAREAN SECTION;  Surgeon: Tereso NewcomerUgonna A Anyanwu, MD;  Location: [facility];  Service: Obstetrics;  Laterality: N/A;  . sweat glan       OB History    Gravida  3   Para  2   Term  2   Preterm      AB      Living  2     SAB      TAB      Ectopic      Multiple      Live Births  2            Home Medications    Prior to Admission  medications   Medication Sig Start Date End Date Taking? Authorizing Provider  acetaminophen (TYLENOL) 325 MG tablet Take 2 tablets (650 mg total) by mouth every 6 (six) hours as needed. 09/23/18   Jesiel Garate, PA-C  benzonatate (TESSALON) 100 MG capsule Take 2 capsules (200 mg total) by mouth every 8 (eight) hours. 09/23/18   Artyom Stencel, PA-C  cetirizine (ZYRTEC) 5 MG tablet Take 1 tablet (5 mg total) by mouth daily. 09/23/18   Maiya Kates, PA-C  fluticasone (FLONASE) 50 MCG/ACT nasal spray Place 1 spray into both nostrils daily. 09/23/18   Lary Eckardt, PA-C  Prenatal Vit-Fe Fumarate-FA (MULTIVITAMIN-PRENATAL) 27-0.8 MG TABS tablet Take 1 tablet by mouth daily at 12 noon. 03/11/17   Alvira MondaySchlossman, Erin, MD  Prenatal Vit-Min-FA-Fish Oil ([facility] PRENATAL GUMMY PO) Take 1 tablet by mouth 3 (three) times daily.    [provider]    Family History Family History  Problem Relation Age of Onset  . Kidney disease Mother   . Diabetes Father     Social History Social History   Tobacco Use  . Smoking status: Former Smoker    Types: Cigarettes    Last attempt to quit: 07/03/2013    Years since quitting: 5.2  . Smokeless tobacco: Never Used  Substance Use Topics  . Alcohol use: Yes    Comment: occasionally  . Drug use: No     Allergies   Patient has no known allergies.   Review of Systems Review of Systems  Constitutional: Positive for chills and fever.  HENT: Positive for congestion.   Respiratory: Positive for cough. Negative for shortness of breath.   Cardiovascular: Negative for chest pain.  Gastrointestinal: Negative for vomiting.     Physical Exam Updated Vital Signs BP (!) 149/88 (BP Location: Left Arm)   Pulse 94   Temp (!) 100.4 F (38 C) (Oral)   Resp 20   Ht 5\' 7"  (1.702 m)   Wt 126.6 kg   SpO2 100%   BMI 43.71 kg/m   Physical Exam Vitals signs and nursing note reviewed.  Constitutional:      General: She is not in acute distress.    Appearance:  She is well-developed. She is not diaphoretic.  HENT:     Head: Normocephalic and atraumatic.     Right Ear: A middle ear effusion is present.     Left Ear: A middle ear effusion is present.     Nose: Nose normal.     Mouth/Throat:     Tonsils: Swelling: 0 on the right. 0 on the left.  Eyes:     General: No scleral icterus.    Conjunctiva/sclera: Conjunctivae normal.  Neck:     Musculoskeletal: Normal range of motion.  Cardiovascular:     Rate and Rhythm: Normal rate and regular rhythm.  Pulmonary:     Effort: Pulmonary effort is normal. No respiratory distress.     Breath sounds: Normal breath sounds.  Skin:    Findings: No rash.  Neurological:     Mental Status: She is alert.      ED Treatments / Results  Labs (all labs ordered are listed, but only abnormal results are displayed) Labs Reviewed - No data to display  EKG None  Radiology No results found.  Procedures Procedures (including critical care time)  Medications Ordered in ED Medications  ibuprofen (ADVIL,MOTRIN) tablet 800 mg (800 mg Oral Given 09/23/18 1129)     Initial Impression / Assessment and Plan / ED Course  I have reviewed the triage vital signs and the nursing notes.  Pertinent labs & imaging results that were available during my care of the patient were reviewed by me and considered in my medical decision making (see chart for details).     32 year old female presents to ED for 1-1/2-day history of influenza-like illness.  She reports fever, cough, rhinorrhea.  Sick contacts including her daughter with similar symptoms.  She is febrile to 100.4 here.  This responded to antipyretics.  Other vital signs within normal limits.  Lungs clear to auscultation bilaterally.  Discussed risks versus benefits of Tamiflu administration as she is still in the window for treatment.  Patient elects not to treat with Tamiflu.   We will treat symptomatically and advised her to follow-up with PCP.  Advised her to  alternate Tylenol and ibuprofen to help with fever and body aches.  Patient is hemodynamically stable, in NAD, and able to ambulate in the ED. Evaluation does not show pathology that would require ongoing emergent intervention or inpatient treatment. I explained the diagnosis to the patient. Pain has been managed and has no complaints prior to discharge. Patient is comfortable with above plan and is stable for discharge at this time. All questions were answered prior to disposition. Strict  return precautions for returning to the ED were discussed. Encouraged follow up with PCP.    Portions of this note were generated with Scientist, clinical (histocompatibility and immunogenetics)Dragon dictation software. Dictation errors may occur despite best attempts at proofreading.   Final Clinical Impressions(s) / ED Diagnoses   Final diagnoses:  Influenza-like illness    ED Discharge Orders         Ordered    benzonatate (TESSALON) 100 MG capsule  Every 8 hours     09/23/18 1215    acetaminophen (TYLENOL) 325 MG tablet  Every 6 hours PRN     09/23/18 1215    fluticasone (FLONASE) 50 MCG/ACT nasal spray  Daily     09/23/18 1215    cetirizine (ZYRTEC) 5 MG tablet  Daily     09/23/18 1215           Dietrich PatesKhatri, Brayn Eckstein, PA-C 09/23/18 1216    Arby BarrettePfeiffer, Marcy, MD 09/24/18 [phone]

## 2020-07-23 ENCOUNTER — Encounter (HOSPITAL_BASED_OUTPATIENT_CLINIC_OR_DEPARTMENT_OTHER): Payer: Self-pay | Admitting: *Deleted

## 2020-07-23 ENCOUNTER — Other Ambulatory Visit (HOSPITAL_BASED_OUTPATIENT_CLINIC_OR_DEPARTMENT_OTHER): Payer: Self-pay | Admitting: Emergency Medicine

## 2020-07-23 ENCOUNTER — Other Ambulatory Visit: Payer: Self-pay

## 2020-07-23 ENCOUNTER — Emergency Department (HOSPITAL_BASED_OUTPATIENT_CLINIC_OR_DEPARTMENT_OTHER)
Admission: EM | Admit: 2020-07-23 | Discharge: 2020-07-23 | Disposition: A | Discharge status: Home / Self Care | Payer: Medicaid Other | Attending: Emergency Medicine | Admitting: Emergency Medicine

## 2020-07-23 DIAGNOSIS — Z87891 Personal history of nicotine dependence: Secondary | ICD-10-CM | POA: Insufficient documentation

## 2020-07-23 DIAGNOSIS — H15102 Unspecified episcleritis, left eye: Secondary | ICD-10-CM | POA: Diagnosis not present

## 2020-07-23 DIAGNOSIS — R102 Pelvic and perineal pain: Secondary | ICD-10-CM | POA: Insufficient documentation

## 2020-07-23 DIAGNOSIS — R1030 Lower abdominal pain, unspecified: Secondary | ICD-10-CM | POA: Diagnosis present

## 2020-07-23 DIAGNOSIS — N939 Abnormal uterine and vaginal bleeding, unspecified: Secondary | ICD-10-CM | POA: Diagnosis not present

## 2020-07-23 DIAGNOSIS — N3001 Acute cystitis with hematuria: Secondary | ICD-10-CM

## 2020-07-23 LAB — URINALYSIS, MICROSCOPIC (REFLEX)
RBC / HPF: >50 RBC/hpf (ref 0–5)
WBC, UA: >50 WBC/hpf (ref 0–5)

## 2020-07-23 LAB — URINALYSIS, ROUTINE W REFLEX MICROSCOPIC
Bilirubin Urine: NEGATIVE
Glucose, UA: NEGATIVE mg/dL
Ketones, ur: NEGATIVE mg/dL
Nitrite: POSITIVE — AB
Protein, ur: 100 mg/dL — AB
Specific Gravity, Urine: 1.025 (ref 1.005–1.030)
pH: 6 (ref 5.0–8.0)

## 2020-07-23 LAB — WET PREP, GENITAL
Sperm: NONE SEEN
Trich, Wet Prep: NONE SEEN
Yeast Wet Prep HPF POC: NONE SEEN

## 2020-07-23 LAB — HIV ANTIBODY (ROUTINE TESTING W REFLEX): HIV Screen 4th Generation wRfx: NONREACTIVE

## 2020-07-23 LAB — PREGNANCY, URINE: Preg Test, Ur: NEGATIVE

## 2020-07-23 MED ORDER — NITROFURANTOIN MONOHYD MACRO 100 MG PO CAPS
100.0000 mg | ORAL_CAPSULE | Freq: Once | ORAL | Status: AC
  Administered 2020-07-23: 100 mg via ORAL
  Filled 2020-07-23: qty 1

## 2020-07-23 MED ORDER — NITROFURANTOIN MONOHYD MACRO 100 MG PO CAPS: 100.0000 mg | ORAL_CAPSULE | Freq: Two times a day (BID) | ORAL | 0 refills | Status: DC

## 2020-07-23 MED FILL — NITROFURANTOIN MONO-MCR 100: 100 | 5 days supply | Qty: 10 | Fill #0

## 2020-07-23 NOTE — Discharge Instructions (Signed)
Please read and follow all provided instructions.  Your diagnoses today include:  1. Acute cystitis with hematuria   2. Pelvic pain   3. Episcleritis of left eye     Tests performed today include:  Urine test - suggests that you have an infection in your bladder  Wet prep -do not suspect other vaginal infection  Gonorrhea, chlamydia, syphilis, HIV testing -is pending, please check mychart for results  Vital signs. See below for your results today.   Medications prescribed:   Macrobid - antibiotic for urinary tract infection  You have been prescribed an antibiotic medicine: take the entire course of medicine even if you are feeling better. Stopping early can cause the antibiotic not to work.  Home care instructions:  Follow any educational materials contained in this packet.  Follow-up instructions: Please follow-up with your primary care provider in 3 days if symptoms are not resolved for further evaluation of your symptoms.  Return instructions:   Please return to the Emergency Department if you experience worsening symptoms.   Return with fever, worsening pain, persistent vomiting, worsening pain in your back.   Please return if you have any other emergent concerns.  Additional Information:  Your vital signs today were: BP (!) 135/95 (BP Location: Right Arm)   Pulse 76   Temp 98.5 F (36.9 C) (Oral)   Resp 18   Ht 5\' 7"  (1.702 m)   Wt 121.6 kg   LMP 07/02/2020   SpO2 100%   Breastfeeding Unknown   BMI 41.97 kg/m  If your blood pressure (BP) was elevated above 135/85 this visit, please have this repeated by your doctor within one month. --------------

## 2020-07-23 NOTE — ED Provider Notes (Signed)
[facility] EMERGENCY DEPARTMENT Provider Note   CSN: [id number] Arrival date & time: 07/23/20  0941     History Chief Complaint  Patient presents with  . Vaginal Bleeding  . Eye pain    Sara Keith is a 34 y.o. female.  Patient with history of hypertension presents the emergency department for evaluation of 2 complaints.  First is a 4-day history of redness to the left lateral eye.  Patient had her lashes done several days before.  No pain or tearing.  The area has gotten slightly worse over the past few days.  No vision changes.  She states that she had this about a year ago.  In addition, patient reports having lower abdominal cramping last night.  This morning she had associated vaginal spotting.  This is unusual because she had her normal menstrual period about a week and a half ago.  No nausea, vomiting, diarrhea.  No fevers. No hematuria or irritative UTI symptoms including dysuria, increased frequency or urgency.  No treatments prior to arrival.  She has had multiple partners in the last several weeks.  She would like to confirm that she does not have a sexually transmitted infection.        Past Medical History:  Diagnosis Date  . Gestational diabetes   . Pregnancy induced hypertension     Patient Active Problem List   Diagnosis Date Noted  . Encounter for postpartum visit 05/07/2014  . Abscess of right thigh 03/29/2014  . Maternal morbid obesity with BMI over 50, antepartum 03/29/2014  . S/P cesarean section 03/29/2014  . Other specified indication for care or intervention related to labor and delivery, unspecified as to episode of care 03/28/2014  . Previous cesarean delivery, antepartum condition or complication 03/20/2014  . Two vessel umbilical cord, antepartum 03/20/2014  . Gestational diabetes mellitus, antepartum 03/18/2014  . Gestational hypertension without significant proteinuria in third trimester 03/03/2014    Past Surgical History:   Procedure Laterality Date  . CESAREAN SECTION    . CESAREAN SECTION N/A 03/29/2014   Procedure: CESAREAN SECTION;  Surgeon: Tereso Newcomer, MD;  Location: [facility];  Service: Obstetrics;  Laterality: N/A;  . sweat glan       OB History    Gravida  3   Para  2   Term  2   Preterm      AB      Living  2     SAB      TAB      Ectopic      Multiple      Live Births  2           Family History  Problem Relation Age of Onset  . Kidney disease Mother   . Diabetes Father     Social History   Tobacco Use  . Smoking status: Former Smoker    Types: Cigarettes    Quit date: 07/03/2013    Years since quitting: 7.0  . Smokeless tobacco: Never Used  Substance Use Topics  . Alcohol use: Yes    Comment: occasionally  . Drug use: Yes    Types: Marijuana    Comment: 3 days ago    Home Medications Prior to Admission medications   Not on File    Allergies    Patient has no known allergies.  Review of Systems   Review of Systems  Constitutional: Negative for fever.  HENT: Negative for rhinorrhea and sore throat.  Eyes: Positive for redness. Negative for photophobia, pain, discharge, itching and visual disturbance.  Respiratory: Negative for cough.   Cardiovascular: Negative for chest pain.  Gastrointestinal: Negative for abdominal pain, diarrhea, nausea and vomiting.  Genitourinary: Positive for pelvic pain and vaginal bleeding. Negative for dysuria, frequency, hematuria and urgency.  Musculoskeletal: Negative for myalgias.  Skin: Negative for rash.  Neurological: Negative for headaches.    Physical Exam Updated Vital Signs BP (!) 135/95 (BP Location: Right Arm)   Pulse 76   Temp 98.5 F (36.9 C) (Oral)   Resp 18   Ht 5\' 7"  (1.702 m)   Wt 121.6 kg   LMP 07/02/2020   SpO2 100%   Breastfeeding Unknown   BMI 41.97 kg/m   Physical Exam Vitals and nursing note reviewed. Exam conducted with a chaperone present.  Constitutional:      General: She  is not in acute distress.    Appearance: She is well-developed.  HENT:     Head: Normocephalic and atraumatic.     Right Ear: External ear normal.     Left Ear: External ear normal.     Nose: Nose normal.  Eyes:     Comments: Right eye normal sclera, cornea, pupillary response.  Left eye mild area of erythema consistent with episcleritis, normal cornea pupillary response to light.  Cardiovascular:     Rate and Rhythm: Normal rate and regular rhythm.     Heart sounds: No murmur heard.   Pulmonary:     Effort: No respiratory distress.     Breath sounds: No wheezing, rhonchi or rales.  Abdominal:     Palpations: Abdomen is soft.     Tenderness: There is no abdominal tenderness. There is no guarding or rebound.  Genitourinary:    Exam position: Lithotomy position.     Labia:        Right: No tenderness.        Left: No tenderness.      Vagina: No vaginal discharge, tenderness or bleeding.     Cervix: No cervical motion tenderness.     Adnexa:        Right: No tenderness.         Left: No tenderness.       Comments: Mild discomfort with insertion of speculum/fingers for bimanual exam. No blood noted.  Musculoskeletal:     Cervical back: Normal range of motion and neck supple.     Right lower leg: No edema.     Left lower leg: No edema.  Skin:    General: Skin is warm and dry.     Findings: No rash.  Neurological:     General: No focal deficit present.     Mental Status: She is alert. Mental status is at baseline.     Motor: No weakness.  Psychiatric:        Mood and Affect: Mood normal.     ED Results / Procedures / Treatments   Labs (all labs ordered are listed, but only abnormal results are displayed) Labs Reviewed  WET PREP, GENITAL - Abnormal; Notable for the following components:      Result Value   Clue Cells Wet Prep HPF POC PRESENT (*)    WBC, Wet Prep HPF POC MODERATE (*)    All other components within normal limits  URINALYSIS, ROUTINE W REFLEX  MICROSCOPIC - Abnormal; Notable for the following components:   APPearance CLOUDY (*)    Hgb urine dipstick LARGE (*)    Protein,  ur 100 (*)    Nitrite POSITIVE (*)    Leukocytes,Ua MODERATE (*)    All other components within normal limits  URINALYSIS, MICROSCOPIC (REFLEX) - Abnormal; Notable for the following components:   Bacteria, UA MANY (*)    All other components within normal limits  PREGNANCY, URINE  RPR  HIV ANTIBODY (ROUTINE TESTING W REFLEX)  GC/CHLAMYDIA PROBE AMP ([place]) NOT AT [facility]    EKG None  Radiology No results found.  Procedures Procedures (including critical care time)  Medications Ordered in ED Medications - No data to display  ED Course  I have reviewed the triage vital signs and the nursing notes.  Pertinent labs & imaging results that were available during my care of the patient were reviewed by me and considered in my medical decision making (see chart for details).  Patient seen and examined.  Patient requesting pelvic exam.  UA, urine pregnancy ordered.  Vital signs reviewed and are as follows: BP (!) 135/95 (BP Location: Right Arm)   Pulse 76   Temp 98.5 F (36.9 C) (Oral)   Resp 18   Ht 5\' 7"  (1.702 m)   Wt 121.6 kg   LMP 07/02/2020   SpO2 100%   Breastfeeding Unknown   BMI 41.97 kg/m   11:42 AM pelvic exam performed earlier with RN chaperone.  UA demonstrates signs of infection.  STI testing pending.  Patient updated on results.  Will start on Macrobid.  The patient was urged to return to the Emergency Department immediately with worsening of current symptoms, worsening abdominal pain, persistent vomiting, blood noted in stools, fever, or any other concerns. The patient verbalized understanding.      MDM Rules/Calculators/A&P                          Patient with pelvic pain, likely related to UTI.  Blood noted may be blood from urine and not the vagina.  Pelvic exam unrevealing.  STI testing pending.  No signs of  pyelonephritis.  Patient appears well, nontoxic.  Do not feel IV antibiotics or blood work-up is indicated at this time.  Remainder of abdomen is nontender.  Final Clinical Impression(s) / ED Diagnoses Final diagnoses:  Acute cystitis with hematuria  Pelvic pain  Episcleritis of left eye    Rx / DC Orders ED Discharge Orders         Ordered    nitrofurantoin, macrocrystal-monohydrate, (MACROBID) 100 MG capsule  2 times daily        07/23/20 1139           07/25/20, PA-C 07/23/20 1143    07/25/20, MD 07/26/20 [phone]

## 2020-07-23 NOTE — ED Triage Notes (Signed)
Eye pain started on Monday, vaginal spotting since yesterday. Stated that she had 2 partners in the past 2 weeks.

## 2020-07-24 LAB — GC/CHLAMYDIA PROBE AMP (~~LOC~~) NOT AT ARMC
Chlamydia: NEGATIVE
Comment: NEGATIVE
Comment: NORMAL
Neisseria Gonorrhea: NEGATIVE

## 2020-07-24 LAB — RPR: RPR Ser Ql: NONREACTIVE

## 2020-08-14 ENCOUNTER — Encounter (HOSPITAL_BASED_OUTPATIENT_CLINIC_OR_DEPARTMENT_OTHER): Payer: Self-pay

## 2020-08-14 ENCOUNTER — Emergency Department (HOSPITAL_BASED_OUTPATIENT_CLINIC_OR_DEPARTMENT_OTHER): Payer: Medicaid Other

## 2020-08-14 ENCOUNTER — Emergency Department (HOSPITAL_BASED_OUTPATIENT_CLINIC_OR_DEPARTMENT_OTHER)
Admission: EM | Admit: 2020-08-14 | Discharge: 2020-08-14 | Disposition: A | Discharge status: Home Health | Payer: Medicaid Other | Attending: Emergency Medicine | Admitting: Emergency Medicine

## 2020-08-14 ENCOUNTER — Other Ambulatory Visit: Payer: Self-pay

## 2020-08-14 DIAGNOSIS — Z87891 Personal history of nicotine dependence: Secondary | ICD-10-CM | POA: Diagnosis not present

## 2020-08-14 DIAGNOSIS — R112 Nausea with vomiting, unspecified: Secondary | ICD-10-CM | POA: Insufficient documentation

## 2020-08-14 DIAGNOSIS — R519 Headache, unspecified: Secondary | ICD-10-CM | POA: Diagnosis present

## 2020-08-14 DIAGNOSIS — K0889 Other specified disorders of teeth and supporting structures: Secondary | ICD-10-CM | POA: Diagnosis not present

## 2020-08-14 MED ORDER — PROMETHAZINE HCL 25 MG/ML IJ SOLN: 12.5000 mg | Freq: Once | INTRAMUSCULAR | Status: AC

## 2020-08-14 MED ORDER — KETOROLAC TROMETHAMINE 30 MG/ML IJ SOLN
30.0000 mg | Freq: Once | INTRAMUSCULAR | Status: AC
  Administered 2020-08-14: 09:00:00 30 mg via INTRAVENOUS
  Filled 2020-08-14: qty 1

## 2020-08-14 MED ORDER — HYDROCODONE-ACETAMINOPHEN 5-325 MG PO TABS: 1.0000 | ORAL_TABLET | Freq: Four times a day (QID) | ORAL | 0 refills | Status: AC | PRN

## 2020-08-14 MED ORDER — PROMETHAZINE HCL 25 MG/ML IJ SOLN
INTRAMUSCULAR | Status: AC
  Administered 2020-08-14: 09:00:00 12.5 mg via INTRAVENOUS
  Filled 2020-08-14: qty 1

## 2020-08-14 MED ORDER — AMOXICILLIN 500 MG PO CAPS: 500.0000 mg | ORAL_CAPSULE | Freq: Three times a day (TID) | ORAL | 0 refills | Status: DC

## 2020-08-14 MED ORDER — SODIUM CHLORIDE 0.9 % IV BOLUS
500.0000 mL | Freq: Once | INTRAVENOUS | Status: AC
  Administered 2020-08-14: 09:00:00 500 mL via INTRAVENOUS

## 2020-08-14 NOTE — ED Provider Notes (Signed)
[facility] EMERGENCY DEPARTMENT Provider Note   CSN: [id number] Arrival date & time: 08/14/20  0160     History Chief Complaint  Patient presents with   Migraine    Sara Keith is a 34 y.o. female.  HPI Patient presents with headache and dental pain.  Has had for last 2 days.  Pain is in the front of her head.  Has had some nausea without vomiting.  No photophobia.  Does not really tend to get headaches.  No fevers or chills.  No numbness or weakness.  No confusion. Also has dental pain.  Left jaw.  Unsure if it is upper or lower.  No difficulty swallowing.  No fevers.  No trauma.    Past Medical History:  Diagnosis Date   Gestational diabetes    Pregnancy induced hypertension     Patient Active Problem List   Diagnosis Date Noted   Encounter for postpartum visit 05/07/2014   Abscess of right thigh 03/29/2014   Maternal morbid obesity with BMI over 50, antepartum 03/29/2014   S/P cesarean section 03/29/2014   Other specified indication for care or intervention related to labor and delivery, unspecified as to episode of care 03/28/2014   Previous cesarean delivery, antepartum condition or complication 03/20/2014   Two vessel umbilical cord, antepartum 03/20/2014   Gestational diabetes mellitus, antepartum 03/18/2014   Gestational hypertension without significant proteinuria in third trimester 03/03/2014    Past Surgical History:  Procedure Laterality Date   CESAREAN SECTION     CESAREAN SECTION N/A 03/29/2014   Procedure: CESAREAN SECTION;  Surgeon: Tereso Newcomer, MD;  Location: [facility];  Service: Obstetrics;  Laterality: N/A;   sweat glan       OB History    Gravida  3   Para  2   Term  2   Preterm      AB      Living  2     SAB      TAB      Ectopic      Multiple      Live Births  2           Family History  Problem Relation Age of Onset   Kidney disease Mother    Diabetes Father     Social  History   Tobacco Use   Smoking status: Former Smoker    Types: Cigarettes    Quit date: 07/03/2013    Years since quitting: 7.1   Smokeless tobacco: Never Used  Substance Use Topics   Alcohol use: Yes    Comment: occasionally   Drug use: Yes    Types: Marijuana    Comment: 3 days ago    Home Medications Prior to Admission medications   Medication Sig Start Date End Date Taking? Authorizing Provider  amoxicillin (AMOXIL) 500 MG capsule Take 1 capsule (500 mg total) by mouth 3 (three) times daily. 08/14/20   Benjiman Core, MD  HYDROcodone-acetaminophen (NORCO/VICODIN) 5-325 MG tablet Take 1-2 tablets by mouth every 6 (six) hours as needed. 08/14/20   Benjiman Core, MD  nitrofurantoin, macrocrystal-monohydrate, (MACROBID) 100 MG capsule Take 1 capsule (100 mg total) by mouth 2 (two) times daily. 07/23/20   Renne Crigler, PA-C    Allergies    Patient has no known allergies.  Review of Systems   Review of Systems  Constitutional: Negative for appetite change.  HENT: Positive for dental problem.   Respiratory: Negative for shortness of breath.   Cardiovascular:  Negative for chest pain.  Gastrointestinal: Positive for nausea. Negative for abdominal pain.  Genitourinary: Negative for flank pain.  Musculoskeletal: Negative for back pain.  Skin: Negative for rash.  Neurological: Positive for headaches.  Psychiatric/Behavioral: Negative for confusion.    Physical Exam Updated Vital Signs BP (!) 165/84 (BP Location: Left Arm)    Pulse 76    Temp 98.1 F (36.7 C) (Oral)    Resp 16    Ht 5\' 7"  (1.702 m)    Wt 135.2 kg    SpO2 100%    BMI 46.67 kg/m   Physical Exam Vitals and nursing note reviewed.  HENT:     Head: Atraumatic.     Mouth/Throat:     Comments: Tenderness to left lower anterior molar.  No swelling.  No fluctuance. Eyes:     Extraocular Movements: Extraocular movements intact.     Pupils: Pupils are equal, round, and reactive to light.    Cardiovascular:     Rate and Rhythm: Normal rate and regular rhythm.  Pulmonary:     Breath sounds: Normal breath sounds.  Abdominal:     Tenderness: There is no abdominal tenderness.  Musculoskeletal:        General: No tenderness.     Cervical back: Neck supple.  Skin:    General: Skin is warm.     Capillary Refill: Capillary refill takes less than 2 seconds.  Neurological:     Mental Status: She is alert and oriented to person, place, and time.     ED Results / Procedures / Treatments   Labs (all labs ordered are listed, but only abnormal results are displayed) Labs Reviewed - No data to display  EKG None  Radiology CT Head Wo Contrast  Result Date: 08/14/2020 CLINICAL DATA:  Headaches for 2 days EXAM: CT HEAD WITHOUT CONTRAST TECHNIQUE: Contiguous axial images were obtained from the base of the skull through the vertex without intravenous contrast. COMPARISON:  None. FINDINGS: Brain: No evidence of acute infarction, hemorrhage, hydrocephalus, extra-axial collection or mass lesion/mass effect. Vascular: No hyperdense vessel or unexpected calcification. Skull: Normal. Negative for fracture or focal lesion. Sinuses/Orbits: No acute finding. Other: None. IMPRESSION: No acute intracranial abnormality noted. Electronically Signed   By: 13/09/2020 M.D.   On: 08/14/2020 09:31    Procedures Procedures (including critical care time)  Medications Ordered in ED Medications  ketorolac (TORADOL) 30 MG/ML injection 30 mg (30 mg Intravenous Given 08/14/20 0918)  promethazine (PHENERGAN) injection 12.5 mg (12.5 mg Intravenous Given 08/14/20 0919)  sodium chloride 0.9 % bolus 500 mL (0 mLs Intravenous Stopped 08/14/20 1018)    ED Course  I have reviewed the triage vital signs and the nursing notes.  Pertinent labs & imaging results that were available during my care of the patient were reviewed by me and considered in my medical decision making (see chart for details).    MDM  Rules/Calculators/A&P                          Patient with headache and dental pain.  Headache done due to limited history of headaches.  Head CT reassuring.  Also left lower jaw dental pain.  Some tenderness.  Will give pain medicines and antibiotics with dental follow-up.  Feels better after migraine cocktail.  Discharge home. Doubt intracranial hemorrhage.  Doubt intracranial abscess.  Doubt dental abscess. Final Clinical Impression(s) / ED Diagnoses Final diagnoses:  Pain, dental  Nonintractable headache, unspecified  chronicity pattern, unspecified headache type    Rx / DC Orders ED Discharge Orders         Ordered    HYDROcodone-acetaminophen (NORCO/VICODIN) 5-325 MG tablet  Every 6 hours PRN        08/14/20 1154    amoxicillin (AMOXIL) 500 MG capsule  3 times daily        08/14/20 1154           Benjiman Core, MD 08/14/20 1156

## 2020-08-14 NOTE — ED Triage Notes (Signed)
Pt states headache for 2 days, taking goody powders without relief, last taken yesterday, also reports toothache lower left , reports nausea, no fevers.

## 2020-08-14 NOTE — ED Notes (Signed)
ED Provider at bedside. 

## 2020-08-14 NOTE — ED Notes (Signed)
Patient transported to CT 

## 2020-08-14 NOTE — ED Notes (Signed)
Pt reports some improvement in pain, marked improvement in nausea.  Resting quietly with eyes closed.

## 2020-09-29 ENCOUNTER — Encounter (HOSPITAL_BASED_OUTPATIENT_CLINIC_OR_DEPARTMENT_OTHER): Payer: Self-pay

## 2020-09-29 ENCOUNTER — Emergency Department (HOSPITAL_BASED_OUTPATIENT_CLINIC_OR_DEPARTMENT_OTHER): Payer: Medicaid Other

## 2020-09-29 ENCOUNTER — Other Ambulatory Visit: Payer: Self-pay

## 2020-09-29 ENCOUNTER — Emergency Department (HOSPITAL_BASED_OUTPATIENT_CLINIC_OR_DEPARTMENT_OTHER)
Admission: EM | Admit: 2020-09-29 | Discharge: 2020-09-29 | Disposition: A | Discharge status: Home / Self Care | Payer: Medicaid Other | Attending: Emergency Medicine | Admitting: Emergency Medicine

## 2020-09-29 DIAGNOSIS — R030 Elevated blood-pressure reading, without diagnosis of hypertension: Secondary | ICD-10-CM | POA: Insufficient documentation

## 2020-09-29 DIAGNOSIS — U071 COVID-19: Secondary | ICD-10-CM | POA: Diagnosis not present

## 2020-09-29 DIAGNOSIS — Z87891 Personal history of nicotine dependence: Secondary | ICD-10-CM | POA: Diagnosis not present

## 2020-09-29 DIAGNOSIS — R5383 Other fatigue: Secondary | ICD-10-CM | POA: Diagnosis present

## 2020-09-29 LAB — URINALYSIS, ROUTINE W REFLEX MICROSCOPIC
Bilirubin Urine: NEGATIVE
Glucose, UA: NEGATIVE mg/dL
Hgb urine dipstick: NEGATIVE
Ketones, ur: NEGATIVE mg/dL
Leukocytes,Ua: NEGATIVE
Nitrite: NEGATIVE
Protein, ur: NEGATIVE mg/dL
Specific Gravity, Urine: 1.015 (ref 1.005–1.030)
pH: 6.5 (ref 5.0–8.0)

## 2020-09-29 LAB — CBC WITH DIFFERENTIAL/PLATELET
Abs Immature Granulocytes: 0.02 10*3/uL (ref 0.00–0.07)
Basophils Absolute: 0 10*3/uL (ref 0.0–0.1)
Basophils Relative: 1 %
Eosinophils Absolute: 0.1 10*3/uL (ref 0.0–0.5)
Eosinophils Relative: 2 %
HCT: 33.8 % — ABNORMAL LOW (ref 36.0–46.0)
Hemoglobin: 10.5 g/dL — ABNORMAL LOW (ref 12.0–15.0)
Immature Granulocytes: 0 %
Lymphocytes Relative: 21 %
Lymphs Abs: 1 10*3/uL (ref 0.7–4.0)
MCH: 21.6 pg — ABNORMAL LOW (ref 26.0–34.0)
MCHC: 31.1 g/dL (ref 30.0–36.0)
MCV: 69.5 fL — ABNORMAL LOW (ref 80.0–100.0)
Monocytes Absolute: 0.8 10*3/uL (ref 0.1–1.0)
Monocytes Relative: 17 %
Neutro Abs: 2.7 10*3/uL (ref 1.7–7.7)
Neutrophils Relative %: 59 %
Platelets: 189 10*3/uL (ref 150–400)
RBC: 4.86 MIL/uL (ref 3.87–5.11)
RDW: 16.7 % — ABNORMAL HIGH (ref 11.5–15.5)
WBC: 4.5 10*3/uL (ref 4.0–10.5)
nRBC: 0 % (ref 0.0–0.2)

## 2020-09-29 LAB — COMPREHENSIVE METABOLIC PANEL
ALT: 16 U/L (ref 0–44)
AST: 17 U/L (ref 15–41)
Albumin: 3.7 g/dL (ref 3.5–5.0)
Alkaline Phosphatase: 48 U/L (ref 38–126)
Anion gap: 8 (ref 5–15)
BUN: 7 mg/dL (ref 6–20)
CO2: 22 mmol/L (ref 22–32)
Calcium: 8.9 mg/dL (ref 8.9–10.3)
Chloride: 103 mmol/L (ref 98–111)
Creatinine, Ser: 0.86 mg/dL (ref 0.44–1.00)
GFR, Estimated: >60 mL/min (ref >60)
Glucose, Bld: 109 mg/dL — ABNORMAL HIGH (ref 70–99)
Potassium: 3.5 mmol/L (ref 3.5–5.1)
Sodium: 133 mmol/L — ABNORMAL LOW (ref 135–145)
Total Bilirubin: 0.3 mg/dL (ref 0.3–1.2)
Total Protein: 7.1 g/dL (ref 6.5–8.1)

## 2020-09-29 LAB — RAPID INFLUENZA A&B ANTIGENS
Influenza A (ARMC): NEGATIVE
Influenza B (ARMC): NEGATIVE

## 2020-09-29 LAB — PREGNANCY, URINE: Preg Test, Ur: NEGATIVE

## 2020-09-29 MED ORDER — DIPHENHYDRAMINE HCL 50 MG/ML IJ SOLN
12.5000 mg | Freq: Once | INTRAMUSCULAR | Status: AC
  Administered 2020-09-29: 12:00:00 12.5 mg via INTRAVENOUS
  Filled 2020-09-29: qty 1

## 2020-09-29 MED ORDER — BENZONATATE 100 MG PO CAPS: 100.0000 mg | ORAL_CAPSULE | Freq: Three times a day (TID) | ORAL | 0 refills | Status: AC

## 2020-09-29 MED ORDER — PROCHLORPERAZINE EDISYLATE 10 MG/2ML IJ SOLN
10.0000 mg | Freq: Once | INTRAMUSCULAR | Status: AC
  Administered 2020-09-29: 12:00:00 10 mg via INTRAVENOUS
  Filled 2020-09-29: qty 2

## 2020-09-29 MED ORDER — NAPROXEN 500 MG PO TABS: 500.0000 mg | ORAL_TABLET | Freq: Two times a day (BID) | ORAL | 0 refills | Status: DC | PRN

## 2020-09-29 MED ORDER — SODIUM CHLORIDE 0.9 % IV BOLUS
1000.0000 mL | Freq: Once | INTRAVENOUS | Status: AC
  Administered 2020-09-29: 12:00:00 1000 mL via INTRAVENOUS

## 2020-09-29 MED ORDER — ONDANSETRON 4 MG PO TBDP: 4.0000 mg | ORAL_TABLET | Freq: Three times a day (TID) | ORAL | 0 refills | Status: AC | PRN

## 2020-09-29 MED ORDER — ACETAMINOPHEN 325 MG PO TABS
650.0000 mg | ORAL_TABLET | Freq: Once | ORAL | Status: AC
  Administered 2020-09-29: 10:00:00 650 mg via ORAL
  Filled 2020-09-29: qty 2

## 2020-09-29 MED FILL — NAPROXEN 500 MG TABS: 500 | 8 days supply | Qty: 15 | Fill #0

## 2020-09-29 MED FILL — BENZONATATE 100 MG CAPS: 100 | 7 days supply | Qty: 21 | Fill #0

## 2020-09-29 MED FILL — ONDANSETRON ODT 4 MG TABLET: 4 | 2 days supply | Qty: 5 | Fill #0

## 2020-09-29 NOTE — ED Notes (Signed)
Pt requesting tylenol for headache. MD aware. See [facility]

## 2020-09-29 NOTE — ED Triage Notes (Signed)
Pt reports abdominal pain, sore throat, weakness and headache since yesterday.

## 2020-09-29 NOTE — ED Provider Notes (Signed)
[facility] EMERGENCY DEPARTMENT Provider Note   CSN: [id number] Arrival date & time: 09/29/20  [id number]     History Chief Complaint  Patient presents with  . Headache  . Fatigue    Sara Keith is a 34 y.o. female who presents to the emergency department with complaints of headache and fatigue since yesterday.  Patient states she developed multiple symptoms yesterday including a frontal headache that had gradual onset with steady progression, currently 7 out of 10, mildly alleviated by Tylenol given per triage, that feels like prior migraines that improved with migraine cocktails in the ER.  She also feels generally weak/fatigued with subjective fevers, chills, cough, sensation of wheezing, nausea, and a few episodes of nonbloody diarrhea.  She denies chest pain, shortness of breath, abdominal pain, melena, hematochezia, dysuria, frequency, urgency, vaginal bleeding, vaginal discharge, leg pain/swelling, or hemoptysis.  She additionally denies visual disturbance, numbness, focal weakness, or dizziness.  Her son recently had RSV 2 weeks ago.  HPI     Past Medical History:  Diagnosis Date  . Gestational diabetes   . Pregnancy induced hypertension     Patient Active Problem List   Diagnosis Date Noted  . Encounter for postpartum visit 05/07/2014  . Abscess of right thigh 03/29/2014  . Maternal morbid obesity with BMI over 50, antepartum 03/29/2014  . S/P cesarean section 03/29/2014  . Other specified indication for care or intervention related to labor and delivery, unspecified as to episode of care 03/28/2014  . Previous cesarean delivery, antepartum condition or complication 03/20/2014  . Two vessel umbilical cord, antepartum 03/20/2014  . Gestational diabetes mellitus, antepartum 03/18/2014  . Gestational hypertension without significant proteinuria in third trimester 03/03/2014    Past Surgical History:  Procedure Laterality Date  . CESAREAN SECTION    .  CESAREAN SECTION N/A 03/29/2014   Procedure: CESAREAN SECTION;  Surgeon: Tereso NewcomerUgonna A Anyanwu, MD;  Location: [facility];  Service: Obstetrics;  Laterality: N/A;  . sweat glan       OB History    Gravida  3   Para  2   Term  2   Preterm      AB      Living  2     SAB      IAB      Ectopic      Multiple      Live Births  2           Family History  Problem Relation Age of Onset  . Kidney disease Mother   . Diabetes Father     Social History   Tobacco Use  . Smoking status: Former Smoker    Types: Cigarettes    Quit date: 07/03/2013    Years since quitting: 7.2  . Smokeless tobacco: Never Used  Substance Use Topics  . Alcohol use: Not Currently    Comment: occasionally  . Drug use: Yes    Types: Marijuana    Comment: occ    Home Medications Prior to Admission medications   Medication Sig Start Date End Date Taking? Authorizing Provider  amoxicillin (AMOXIL) 500 MG capsule Take 1 capsule (500 mg total) by mouth 3 (three) times daily. 08/14/20   Benjiman CorePickering, Nathan, MD  HYDROcodone-acetaminophen (NORCO/VICODIN) 5-325 MG tablet Take 1-2 tablets by mouth every 6 (six) hours as needed. 08/14/20   Benjiman CorePickering, Nathan, MD  nitrofurantoin, macrocrystal-monohydrate, (MACROBID) 100 MG capsule Take 1 capsule (100 mg total) by mouth 2 (two) times daily. 07/23/20  Renne Crigler, PA-C    Allergies    Patient has no known allergies.  Review of Systems   Review of Systems  Constitutional: Positive for chills, fatigue and fever (subjective).  HENT: Negative for congestion and ear pain.   Eyes: Negative for visual disturbance.  Respiratory: Positive for cough and wheezing. Negative for shortness of breath.   Cardiovascular: Negative for chest pain.  Gastrointestinal: Positive for diarrhea and nausea. Negative for abdominal pain, anal bleeding, blood in stool, constipation and vomiting.  Genitourinary: Negative for dysuria, frequency, urgency, vaginal bleeding and vaginal  discharge.  Musculoskeletal: Negative for neck stiffness.  Neurological: Positive for weakness (generalized) and headaches. Negative for dizziness, seizures, syncope, speech difficulty and numbness.  All other systems reviewed and are negative.   Physical Exam Updated Vital Signs BP (!) 151/108 (BP Location: Left Arm)   Pulse 85   Temp 98.5 F (36.9 C) (Oral)   Resp 18   Ht 5\' 7"  (1.702 m)   Wt 120.2 kg   SpO2 100%   BMI 41.50 kg/m   Physical Exam Vitals and nursing note reviewed.  Constitutional:      General: She is not in acute distress.    Appearance: She is well-developed. She is not toxic-appearing.  HENT:     Head: Normocephalic and atraumatic.     Right Ear: Ear canal normal. Tympanic membrane is not perforated, erythematous, retracted or bulging.     Left Ear: Ear canal normal. Tympanic membrane is not perforated, erythematous, retracted or bulging.     Ears:     Comments: No mastoid erythema/swelling/tenderness.     Nose:     Right Sinus: No maxillary sinus tenderness or frontal sinus tenderness.     Left Sinus: No maxillary sinus tenderness or frontal sinus tenderness.     Mouth/Throat:     Pharynx: Uvula midline. No oropharyngeal exudate or posterior oropharyngeal erythema.     Comments: Posterior oropharynx is symmetric appearing. Patient tolerating own secretions without difficulty. No trismus. No drooling. No hot potato voice. No swelling beneath the tongue, submandibular compartment is soft.  Eyes:     General: Vision grossly intact. Gaze aligned appropriately.        Right eye: No discharge.        Left eye: No discharge.     Extraocular Movements: Extraocular movements intact.     Conjunctiva/sclera: Conjunctivae normal.     Pupils: Pupils are equal, round, and reactive to light.     Comments: PERRL. No proptosis.   Cardiovascular:     Rate and Rhythm: Normal rate and regular rhythm.     Heart sounds: No murmur heard.   Pulmonary:     Effort:  Pulmonary effort is normal. No respiratory distress.     Breath sounds: Normal breath sounds. No wheezing, rhonchi or rales.  Abdominal:     General: There is no distension.     Palpations: Abdomen is soft.     Tenderness: There is no abdominal tenderness. There is no guarding.  Musculoskeletal:     Cervical back: Normal range of motion and neck supple. No edema or rigidity.  Lymphadenopathy:     Cervical: No cervical adenopathy.  Skin:    General: Skin is warm and dry.     Findings: No rash.  Neurological:     Mental Status: She is alert.     Comments: Alert. Clear speech. No facial droop. CNIII-XII grossly intact. Bilateral upper and lower extremities' sensation grossly intact. 5/5 symmetric  strength with grip strength and with plantar and dorsi flexion bilaterally . Normal finger to nose bilaterally. Negative pronator drift. Negative Romberg sign. Gait is steady and intact.   Psychiatric:        Behavior: Behavior normal.     ED Results / Procedures / Treatments   Labs (all labs ordered are listed, but only abnormal results are displayed) Labs Reviewed  COMPREHENSIVE METABOLIC PANEL - Abnormal; Notable for the following components:      Result Value   Sodium 133 (*)    Glucose, Bld 109 (*)    All other components within normal limits  CBC WITH DIFFERENTIAL/PLATELET - Abnormal; Notable for the following components:   Hemoglobin 10.5 (*)    HCT 33.8 (*)    MCV 69.5 (*)    MCH 21.6 (*)    RDW 16.7 (*)    All other components within normal limits  RAPID INFLUENZA A&B ANTIGENS  SARS CORONAVIRUS 2 (TAT 6-24 HRS)  URINALYSIS, ROUTINE W REFLEX MICROSCOPIC  PREGNANCY, URINE    EKG None  Radiology DG Chest Portable 1 View  Result Date: 09/29/2020 CLINICAL DATA:  Cough and fatigue with headache EXAM: PORTABLE CHEST 1 VIEW COMPARISON:  March 27, 2010 FINDINGS: Lungs are clear. Heart is upper normal in size with pulmonary vascularity normal. No adenopathy. No bone lesions.  IMPRESSION: Lungs clear.  Heart upper normal in size. Electronically Signed   By: Bretta Bang III M.D.   On: 09/29/2020 12:25    Procedures Procedures (including critical care time)  Medications Ordered in ED Medications  acetaminophen (TYLENOL) tablet 650 mg (650 mg Oral Given 09/29/20 1002)    ED Course  I have reviewed the triage vital signs and the nursing notes.  Pertinent labs & imaging results that were available during my care of the patient were reviewed by me and considered in my medical decision making (see chart for details).  Sara Keith was evaluated in Emergency Department on 09/29/2020 for the symptoms described in the history of present illness. He/she was evaluated in the context of the global COVID-19 pandemic, which necessitated consideration that the patient might be at risk for infection with the SARS-CoV-2 virus that causes COVID-19. Institutional protocols and algorithms that pertain to the evaluation of patients at risk for COVID-19 are in a state of rapid change based on information released by regulatory bodies including the CDC and federal and state organizations. These policies and algorithms were followed during the patient's care in the ED.    MDM Rules/Calculators/A&P                          Patient presents to the ED with complaints of fatigue, headache, respiratory sxs, nausea, & diarrhea.  Patient is nontoxic, resting comfortably, vitals having out the exception of elevated blood pressure, doubt hypertensive emergency.  Additional history obtained:  Additional history obtained from chart review nursing note reviewed..  Lab Tests:  I Ordered, reviewed, and interpreted labs, which included:  CBC: Anemia similar to prior ranges CMP: Mild hyponatremia, receiving normal saline. Urinalysis: Unremarkable Pregnancy test: Negative Flu test: Negative Covid test: Pending  Imaging Studies ordered:  I ordered imaging studies which included  chest x-ray, I independently visualized and interpreted imaging which showed Lungs clear.  Heart upper normal in size  Exam is without signs of AOM, AOE, or mastoiditis. Oropharyngeal exam is benign, per Centor score doubt strep, no signs of RPA/PTA.Marland Kitchen No sinus tenderness. No meningeal  signs. Lungs are CTA without focal adventitious sounds, no signs of increased work of breathing, CXR without infiltrate, doubt CAP.  Patient is not wheezing.  No signs of respiratory distress.  In terms of her headache she received a migraine cocktail and is feeling much better, gradual onset, steady progression, similar to prior, no neuro deficits, no nuchal rigidity, no visual disturbance, do not suspect acute CVA, SAH, mass, meningitis, giant cell arteritis, dural venous sinus thrombosis, acute angle-closure glaucoma, or meningitis.  Abdomen nontender w/o peritoneal signs. Overall suspect viral.  Recommended supportive care. I discussed results, treatment plan, need for follow-up, and return precautions with the patient.  Provided opportunity for questions, patient confirmed understanding and is in agreement with plan.    Portions of this note were generated with Scientist, clinical (histocompatibility and immunogenetics). Dictation errors may occur despite best attempts at proofreading.  Final Clinical Impression(s) / ED Diagnoses Final diagnoses:  Fatigue, unspecified type    Rx / DC Orders ED Discharge Orders         Ordered    benzonatate (TESSALON) 100 MG capsule  Every 8 hours        09/29/20 1313    naproxen (NAPROSYN) 500 MG tablet  2 times daily PRN        09/29/20 1313    ondansetron (ZOFRAN ODT) 4 MG disintegrating tablet  Every 8 hours PRN        09/29/20 1313           Anzleigh Slaven, Moshannon R, PA-C 09/29/20 1315    Rozelle Logan, DO 09/29/20 2044

## 2020-09-29 NOTE — Discharge Instructions (Addendum)
You were seen in the ER today for generalized weakness, headache, as well as GI and respiratory symptoms.  Your work-up was overall reassuring.  Your blood work did show that you are anemic but this is similar to prior labs you have had done.  Your chest x-ray did not show pneumonia.  Your influenza test was negative. We have tested you for COVID 19, we will call you within the next 48 hours if results are positive, you may also view these results on MyChart.   We are instructing patient's with COVID 19 or symptoms of COVID 19 to isolate themselves for 14 days. You may be able to discontinue self isolation if the following conditions are met:   Persons with COVID-19 who have symptoms and were directed to care for themselves at home may discontinue home isolation under the  following conditions: - It has been at least 10 days have passed since symptoms first appeared. - AND at least 3 days (72 hours) have passed since recovery defined as resolution of fever without the use of fever-reducing medications and improvement in respiratory symptoms (e.g., cough, shortness of breath)  Please follow the below instructions.  We are sending you with the following medicines to help with your symptoms: -Tessalon: Take every hours as needed for coughing -Zofran: Take every hours as needed for nausea/vomiting - Naproxen is a nonsteroidal anti-inflammatory medication that will help with pain and swelling. Be sure to take this medication as prescribed with food, 1 pill every 12 hours,  It should be taken with food, as it can cause stomach upset, and more seriously, stomach bleeding. Do not take other nonsteroidal anti-inflammatory medications with this such as Advil, Motrin, Aleve, Mobic, Goodie Powder, or Motrin.    You make take Tylenol per over the counter dosing with these medications.   We have prescribed you new medication(s) today. Discuss the medications prescribed today with your pharmacist as they can have  adverse effects and interactions with your other medicines including over the counter and prescribed medications. Seek medical evaluation if you start to experience new or abnormal symptoms after taking one of these medicines, seek care immediately if you start to experience difficulty breathing, feeling of your throat closing, facial swelling, or rash as these could be indications of a more serious allergic reaction   Please follow up with primary care within 3-5 days for re-evaluation- call prior to going to the office to make them aware of your symptoms as some offices are altering their method of seeing patients with COVID 19 symptoms, we have also provided our [place] covid clinic for follow up as well.  Return to the ER for new or worsening symptoms including but not limited to increased work of breathing, chest pain, passing out, or any other concerns.       Person Under Monitoring Name: Sara Keith  Location: [address] [place] [address]   Infection Prevention Recommendations for Individuals Confirmed to have, or Being Evaluated for, 2019 Novel Coronavirus (COVID-19) Infection Who Receive Care at Home  Individuals who are confirmed to have, or are being evaluated for, COVID-19 should follow the prevention steps below until a healthcare provider or local or state health department says they can return to normal activities.  Stay home except to get medical care You should restrict activities outside your home, except for getting medical care. Do not go to work, school, or public areas, and do not use public transportation or taxis.  Call ahead before  visiting your doctor Before your medical appointment, call the healthcare provider and tell them that you have, or are being evaluated for, COVID-19 infection. This will help the healthcare provider's office take steps to keep other people from getting infected. Ask your healthcare provider to call the local or state  health department.  Monitor your symptoms Seek prompt medical attention if your illness is worsening (e.g., difficulty breathing). Before going to your medical appointment, call the healthcare provider and tell them that you have, or are being evaluated for, COVID-19 infection. Ask your healthcare provider to call the local or state health department.  Wear a facemask You should wear a facemask that covers your nose and mouth when you are in the same room with other people and when you visit a healthcare provider. People who live with or visit you should also wear a facemask while they are in the same room with you.  Separate yourself from other people in your home As much as possible, you should stay in a different room from other people in your home. Also, you should use a separate bathroom, if available.  Avoid sharing household items You should not share dishes, drinking glasses, cups, eating utensils, towels, bedding, or other items with other people in your home. After using these items, you should wash them thoroughly with soap and water.  Cover your coughs and sneezes Cover your mouth and nose with a tissue when you cough or sneeze, or you can cough or sneeze into your sleeve. Throw used tissues in a lined trash can, and immediately wash your hands with soap and water for at least 20 seconds or use an alcohol-based hand rub.  Wash your [facility] your hands often and thoroughly with soap and water for at least 20 seconds. You can use an alcohol-based hand sanitizer if soap and water are not available and if your hands are not visibly dirty. Avoid touching your eyes, nose, and mouth with unwashed hands.   Prevention Steps for Caregivers and Household Members of Individuals Confirmed to have, or Being Evaluated for, COVID-19 Infection Being Cared for in the Home  If you live with, or provide care at home for, a person confirmed to have, or being evaluated for, COVID-19  infection please follow these guidelines to prevent infection:  Follow healthcare provider's instructions Make sure that you understand and can help the patient follow any healthcare provider instructions for all care.  Provide for the patient's basic needs You should help the patient with basic needs in the home and provide support for getting groceries, prescriptions, and other personal needs.  Monitor the patient's symptoms If they are getting sicker, call his or her medical provider and tell them that the patient has, or is being evaluated for, COVID-19 infection. This will help the healthcare provider's office take steps to keep other people from getting infected. Ask the healthcare provider to call the local or state health department.  Limit the number of people who have contact with the patient If possible, have only one caregiver for the patient. Other household members should stay in another home or place of residence. If this is not possible, they should stay in another room, or be separated from the patient as much as possible. Use a separate bathroom, if available. Restrict visitors who do not have an essential need to be in the home.  Keep older adults, very young children, and other sick people away from the patient Keep older adults, very young children, and  those who have compromised immune systems or chronic health conditions away from the patient. This includes people with chronic heart, lung, or kidney conditions, diabetes, and cancer.  Ensure good ventilation Make sure that shared spaces in the home have good air flow, such as from an air conditioner or an opened window, weather permitting.  Wash your hands often Wash your hands often and thoroughly with soap and water for at least 20 seconds. You can use an alcohol based hand sanitizer if soap and water are not available and if your hands are not visibly dirty. Avoid touching your eyes, nose, and mouth with  unwashed hands. Use disposable paper towels to dry your hands. If not available, use dedicated cloth towels and replace them when they become wet.  Wear a facemask and gloves Wear a disposable facemask at all times in the room and gloves when you touch or have contact with the patient's blood, body fluids, and/or secretions or excretions, such as sweat, saliva, sputum, nasal mucus, vomit, urine, or feces.  Ensure the mask fits over your nose and mouth tightly, and do not touch it during use. Throw out disposable facemasks and gloves after using them. Do not reuse. Wash your hands immediately after removing your facemask and gloves. If your personal clothing becomes contaminated, carefully remove clothing and launder. Wash your hands after handling contaminated clothing. Place all used disposable facemasks, gloves, and other waste in a lined container before disposing them with other household waste. Remove gloves and wash your hands immediately after handling these items.  Do not share dishes, glasses, or other household items with the patient Avoid sharing household items. You should not share dishes, drinking glasses, cups, eating utensils, towels, bedding, or other items with a patient who is confirmed to have, or being evaluated for, COVID-19 infection. After the person uses these items, you should wash them thoroughly with soap and water.  Wash laundry thoroughly Immediately remove and wash clothes or bedding that have blood, body fluids, and/or secretions or excretions, such as sweat, saliva, sputum, nasal mucus, vomit, urine, or feces, on them. Wear gloves when handling laundry from the patient. Read and follow directions on labels of laundry or clothing items and detergent. In general, wash and dry with the warmest temperatures recommended on the label.  Clean all areas the individual has used often Clean all touchable surfaces, such as counters, tabletops, doorknobs, bathroom fixtures,  toilets, phones, keyboards, tablets, and bedside tables, every day. Also, clean any surfaces that may have blood, body fluids, and/or secretions or excretions on them. Wear gloves when cleaning surfaces the patient has come in contact with. Use a diluted bleach solution (e.g., dilute bleach with 1 part bleach and 10 parts water) or a household disinfectant with a label that says EPA-registered for coronaviruses. To make a bleach solution at home, add 1 tablespoon of bleach to 1 quart (4 cups) of water. For a larger supply, add  cup of bleach to 1 gallon (16 cups) of water. Read labels of cleaning products and follow recommendations provided on product labels. Labels contain instructions for safe and effective use of the cleaning product including precautions you should take when applying the product, such as wearing gloves or eye protection and making sure you have good ventilation during use of the product. Remove gloves and wash hands immediately after cleaning.  Monitor yourself for signs and symptoms of illness Caregivers and household members are considered close contacts, should monitor their health, and will be asked to  limit movement outside of the home to the extent possible. Follow the monitoring steps for close contacts listed on the symptom monitoring form.   ? If you have additional questions, contact your local health department or call the epidemiologist on call at [phone] (available 24/7). ? This guidance is subject to change. For the most up-to-date guidance from [facility], please refer to their website: [email or web address]

## 2020-09-30 LAB — SARS CORONAVIRUS 2 (TAT 6-24 HRS): SARS Coronavirus 2: POSITIVE — AB

## 2020-10-02 ENCOUNTER — Telehealth (HOSPITAL_COMMUNITY): Payer: Self-pay

## 2020-10-08 ENCOUNTER — Telehealth (INDEPENDENT_AMBULATORY_CARE_PROVIDER_SITE_OTHER): Payer: Medicaid Other | Admitting: Nurse Practitioner

## 2020-10-08 DIAGNOSIS — Z1152 Encounter for screening for COVID-19: Secondary | ICD-10-CM

## 2020-10-08 NOTE — Progress Notes (Signed)
Virtual Visit via Telephone Note  I connected with Dianna Limbo on 10/08/20 at  3:00 PM EST by telephone and verified that I am speaking with the correct person using two identifiers.  Location: Patient: home Provider: office   I discussed the limitations, risks, security and privacy concerns of performing an evaluation and management service by telephone and the availability of in person appointments. I also discussed with the patient that there may be a patient responsible charge related to this service. The patient expressed understanding and agreed to proceed.   History of Present Illness:  Patient presents today for televisit with post COVID care clinic.  Patient was seen in the ED on 09/29/2020.  She was diagnosed with Covid.  She states that since that time she has felt much better.  She states that she had a mild case and is no longer having symptoms other than fatigue.  She would like a work note to return to work next Tuesday.  She is trying to stay active.  She is staying well-hydrated. Denies f/c/s, n/v/d, hemoptysis, PND, chest pain or edema.     Observations/Objective:  Vitals with BMI 09/29/2020 09/29/2020 09/29/2020  Height - - -  Weight - - -  BMI - - -  Systolic 150 152 355  Diastolic 93 94 91  Pulse 78 70 73      Assessment and Plan:  Covid 19 Cough:   Stay well hydrated  Stay active  Deep breathing exercises  May start vitamin C daily, vitamin D3 daily, Zinc daily  May take tylenol or fever or pain  May take mucinex DM twice daily   Follow Up Instructions:   Follow up in 2 weeks or sooner if needed    I discussed the assessment and treatment plan with the patient. The patient was provided an opportunity to ask questions and all were answered. The patient agreed with the plan and demonstrated an understanding of the instructions.   The patient was advised to call back or seek an in-person evaluation if the symptoms worsen or if the  condition fails to improve as anticipated.  I provided 22 minutes of non-face-to-face time during this encounter.   Ivonne Andrew, NP

## 2020-10-08 NOTE — Patient Instructions (Addendum)
Covid 19 Cough:   Stay well hydrated  Stay active  Deep breathing exercises  May start vitamin C daily, vitamin D3 daily, Zinc daily  May take tylenol or fever or pain  May take mucinex DM twice daily   Follow up:  Follow up in 2 weeks or sooner if needed

## 2022-08-05 ENCOUNTER — Other Ambulatory Visit: Payer: Self-pay

## 2022-08-05 ENCOUNTER — Emergency Department (HOSPITAL_BASED_OUTPATIENT_CLINIC_OR_DEPARTMENT_OTHER)
Admission: EM | Admit: 2022-08-05 | Discharge: 2022-08-05 | Disposition: A | Discharge status: Home / Self Care | Payer: Medicaid Other | Attending: Emergency Medicine | Admitting: Emergency Medicine

## 2022-08-05 ENCOUNTER — Emergency Department (HOSPITAL_BASED_OUTPATIENT_CLINIC_OR_DEPARTMENT_OTHER): Payer: Medicaid Other | Attending: Emergency Medicine

## 2022-08-05 DIAGNOSIS — M79671 Pain in right foot: Secondary | ICD-10-CM | POA: Diagnosis present

## 2022-08-05 DIAGNOSIS — Y990 Civilian activity done for income or pay: Secondary | ICD-10-CM | POA: Diagnosis not present

## 2022-08-05 DIAGNOSIS — M79672 Pain in left foot: Secondary | ICD-10-CM | POA: Insufficient documentation

## 2022-08-05 DIAGNOSIS — X501XXA Overexertion from prolonged static or awkward postures, initial encounter: Secondary | ICD-10-CM | POA: Diagnosis not present

## 2022-08-05 MED ORDER — NAPROXEN 500 MG PO TABS: 500.0000 mg | ORAL_TABLET | Freq: Two times a day (BID) | ORAL | 0 refills | Status: AC | PRN

## 2022-08-05 NOTE — Discharge Instructions (Signed)
You are seen in the emergency department for evaluation of pain in your heels.  This is likely from overuse along with footwear that does not have good padding.  Please use ice to the affected area after standing a long time, gentle stretching, and contact podiatry for outpatient evaluation.  Ibuprofen or Naprosyn as needed for pain.

## 2022-08-05 NOTE — ED Provider Notes (Signed)
[facility] EMERGENCY DEPARTMENT Provider Note   CSN: [id number] Arrival date & time: 08/05/22  1928     History {Add pertinent medical, surgical, social history, OB history to HPI:1} Chief Complaint  Patient presents with  . Foot Pain    Sara Keith is a 36 y.o. female.  She is here with a complaint of bilateral heel pain its been going on for months.  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  She said she wears sneakers.  No trauma.  Pain is worse with activity improves with rest.  No numbness.  No foreign body sensation or trauma.  The history is provided by the patient.  Foot Pain This is a new problem. The problem occurs daily. The problem has not changed since onset.Pertinent negatives include no chest pain, no abdominal pain, no headaches and no shortness of breath. The symptoms are aggravated by walking. Nothing relieves the symptoms. She has tried rest for the symptoms. The treatment provided no relief.       Home Medications Prior to Admission medications   Medication Sig Start Date End Date Taking? Authorizing Provider  amoxicillin (AMOXIL) 500 MG capsule Take 1 capsule (500 mg total) by mouth 3 (three) times daily. 08/14/20   Davonna Belling, MD  benzonatate (TESSALON) 100 MG capsule Take 1 capsule (100 mg total) by mouth every 8 (eight) hours. 09/29/20   Petrucelli, Glynda Jaeger, PA-C  HYDROcodone-acetaminophen (NORCO/VICODIN) 5-325 MG tablet Take 1-2 tablets by mouth every 6 (six) hours as needed. 08/14/20   Davonna Belling, MD  naproxen (NAPROSYN) 500 MG tablet Take 1 tablet (500 mg total) by mouth 2 (two) times daily as needed for moderate pain. 09/29/20   Petrucelli, Glynda Jaeger, PA-C  nitrofurantoin, macrocrystal-monohydrate, (MACROBID) 100 MG capsule Take 1 capsule (100 mg total) by mouth 2 (two) times daily. 07/23/20   Carlisle Cater, PA-C  ondansetron (ZOFRAN ODT) 4 MG disintegrating tablet Take 1 tablet (4  mg total) by mouth every 8 (eight) hours as needed for nausea or vomiting. 09/29/20   Petrucelli, Glynda Jaeger, PA-C      Allergies    Patient has no known allergies.    Review of Systems   Review of Systems  Constitutional:  Negative for fever.  Respiratory:  Negative for shortness of breath.   Cardiovascular:  Negative for chest pain.  Gastrointestinal:  Negative for abdominal pain.  Skin:  Negative for wound.  Neurological:  Negative for headaches.    Physical Exam Updated Vital Signs BP 133/79   Pulse 73   Temp 98.2 F (36.8 C) (Oral)   Resp 16   LMP 07/15/2022 (Exact Date)   SpO2 100%  Physical Exam Constitutional:      Appearance: Normal appearance. She is well-developed. She is obese.  HENT:     Head: Normocephalic and atraumatic.  Eyes:     Conjunctiva/sclera: Conjunctivae normal.  Musculoskeletal:        General: Tenderness present. No deformity. Normal range of motion.     Cervical back: Neck supple.     Right lower leg: No edema.     Left lower leg: No edema.     Comments: Both feet have intact distal pulses.  Normal sensation.  She has diffuse tenderness on both of her heels not so much in the arch or midfoot or digits.  No open wounds.  No significant swelling or overlying skin changes.  Skin:    General: Skin  is warm and dry.  Neurological:     General: No focal deficit present.     Mental Status: She is alert.     GCS: GCS eye subscore is 4. GCS verbal subscore is 5. GCS motor subscore is 6.     Sensory: No sensory deficit.     Motor: No weakness.    ED Results / Procedures / Treatments   Labs (all labs ordered are listed, but only abnormal results are displayed) Labs Reviewed - No data to display  EKG None  Radiology No results found.  Procedures Procedures  {Document cardiac monitor, telemetry assessment procedure when appropriate:1}  Medications Ordered in ED Medications - No data to display  ED Course/ Medical Decision Making/ A&P                            Medical Decision Making Amount and/or Complexity of Data Reviewed Radiology: ordered.   ***  {Document critical care time when appropriate:1} {Document review of labs and clinical decision tools ie heart score, Chads2Vasc2 etc:1}  {Document your independent review of radiology images, and any outside records:1} {Document your discussion with family members, caretakers, and with consultants:1} {Document social determinants of health affecting pt's care:1} {Document your decision making why or why not admission, treatments were needed:1} Final Clinical Impression(s) / ED Diagnoses Final diagnoses:  None    Rx / DC Orders ED Discharge Orders     None

## 2022-08-05 NOTE — ED Triage Notes (Signed)
Pt reports bilateral foot pain at the heel for the last few months. Pt reports injury to right foot but reports "bump" to backside of left foot.

## 2022-08-17 ENCOUNTER — Ambulatory Visit: Payer: Medicaid Other | Admitting: Podiatry

## 2024-03-02 ENCOUNTER — Encounter (HOSPITAL_BASED_OUTPATIENT_CLINIC_OR_DEPARTMENT_OTHER): Payer: Self-pay | Admitting: Emergency Medicine

## 2024-03-02 ENCOUNTER — Emergency Department (HOSPITAL_BASED_OUTPATIENT_CLINIC_OR_DEPARTMENT_OTHER)
Admission: EM | Admit: 2024-03-02 | Discharge: 2024-03-02 | Disposition: A | Discharge status: Home / Self Care | Attending: Emergency Medicine | Admitting: Emergency Medicine

## 2024-03-02 ENCOUNTER — Other Ambulatory Visit: Payer: Self-pay

## 2024-03-02 DIAGNOSIS — I10 Essential (primary) hypertension: Secondary | ICD-10-CM | POA: Insufficient documentation

## 2024-03-02 DIAGNOSIS — L03314 Cellulitis of groin: Secondary | ICD-10-CM | POA: Diagnosis not present

## 2024-03-02 DIAGNOSIS — L02211 Cutaneous abscess of abdominal wall: Secondary | ICD-10-CM | POA: Diagnosis present

## 2024-03-02 DIAGNOSIS — L03311 Cellulitis of abdominal wall: Secondary | ICD-10-CM | POA: Insufficient documentation

## 2024-03-02 DIAGNOSIS — Z79899 Other long term (current) drug therapy: Secondary | ICD-10-CM | POA: Diagnosis not present

## 2024-03-02 MED ORDER — HYDROCHLOROTHIAZIDE 12.5 MG PO TABS: 12.5000 mg | ORAL_TABLET | Freq: Every day | ORAL | 0 refills | Status: AC

## 2024-03-02 MED ORDER — CEPHALEXIN 500 MG PO CAPS: 500.0000 mg | ORAL_CAPSULE | Freq: Four times a day (QID) | ORAL | 0 refills | Status: AC

## 2024-03-02 MED ORDER — SULFAMETHOXAZOLE-TRIMETHOPRIM 800-160 MG PO TABS: 1.0000 | ORAL_TABLET | Freq: Two times a day (BID) | ORAL | 0 refills | Status: AC

## 2024-03-02 NOTE — ED Provider Notes (Signed)
 [place] EM[facility]ENCY DEPARTMENT AT [facility] Provider Note   CSN: [id number] Arrival date & time: 03/02/24  1417     History Chief Complaint  Patient presents with   Abscess    Sara Keith is a 38 y.o. female.  Patient past history significant for hypertension presents the emergency department today with concerns of an abscess.  She reports having multiple abscesses 1 at the lower end of her abdomen above her C-section scar as well as some on her thighs.  She reports that the one on her abdomen has been draining for the last day or so.  Denies any recent fever chills or bodyaches.  Reports that the wounds on her thighs has not been draining.   Abscess      Home Medications Prior to Admission medications   Medication Sig Start Date End Date Taking? Authorizing Provider  cephALEXin  (KEFLEX ) 500 MG capsule Take 1 capsule (500 mg total) by mouth 4 (four) times daily. 03/02/24  Yes Valkyrie Guardiola A, PA-C  hydrochlorothiazide (HYDRODIURIL) 12.5 MG tablet Take 1 tablet (12.5 mg total) by mouth daily. 03/02/24 04/01/24 Yes Roni Scow A, PA-C  sulfamethoxazole -trimethoprim  (BACTRIM  DS) 800-160 MG tablet Take 1 tablet by mouth 2 (two) times daily for 5 days. 03/02/24 03/07/24 Yes Jaycie Kregel A, PA-C  benzonatate  (TESSALON ) 100 MG capsule Take 1 capsule (100 mg total) by mouth every 8 (eight) hours. 09/29/20   Petrucelli, Samantha R, PA-C  HYDROcodone -acetaminophen  (NORCO/VICODIN) 5-325 MG tablet Take 1-2 tablets by mouth every 6 (six) hours as needed. 08/14/20   Mozell Arias, MD  naproxen  (NAPROSYN ) 500 MG tablet Take 1 tablet (500 mg total) by mouth 2 (two) times daily as needed for moderate pain. 08/05/22   Tonya Fredrickson, MD  ondansetron  (ZOFRAN  ODT) 4 MG disintegrating tablet Take 1 tablet (4 mg total) by mouth every 8 (eight) hours as needed for nausea or vomiting. 09/29/20   Petrucelli, Samantha R, PA-C      Allergies    Patient has no known allergies.     Review of Systems   Review of Systems  Skin:  Positive for wound.  All other systems reviewed and are negative.   Physical Exam Updated Vital Signs BP (!) 168/111   Pulse (!) 112   Temp 98.9 F (37.2 C) (Oral)   Resp 18   Ht 5\' 7"  (1.702 m)   Wt 128.4 kg   SpO2 100%   BMI 44.32 kg/m  Physical Exam Vitals and nursing note reviewed.  Constitutional:      General: She is not in acute distress.    Appearance: She is well-developed.  HENT:     Head: Normocephalic and atraumatic.  Eyes:     Conjunctiva/sclera: Conjunctivae normal.  Cardiovascular:     Rate and Rhythm: Normal rate and regular rhythm.     Heart sounds: No murmur heard. Pulmonary:     Effort: Pulmonary effort is normal. No respiratory distress.     Breath sounds: Normal breath sounds.  Abdominal:     Palpations: Abdomen is soft.     Tenderness: There is no abdominal tenderness.  Musculoskeletal:        General: No swelling.     Cervical back: Neck supple.  Skin:    General: Skin is warm and dry.     Capillary Refill: Capillary refill takes less than 2 seconds.     Findings: Lesion and rash present.     Comments: Midline lower abdomen has what appears  to be an open and draining abscess that is draining primarily purulent discharge.  There is surrounding erythema and induration of the skin suggestive of cellulitis.  Bilateral thighs with multiple areas of indurated skin but no obvious abscess formation with no appreciable fluctuance in the skin or active drainage seen.  Neurological:     Mental Status: She is alert.  Psychiatric:        Mood and Affect: Mood normal.     ED Results / Procedures / Treatments   Labs (all labs ordered are listed, but only abnormal results are displayed) Labs Reviewed - No data to display  EKG None  Radiology No results found.  Procedures Procedures    Medications Ordered in ED Medications - No data to display  ED Course/ Medical Decision Making/ A&P                                 Medical Decision Making  This patient presents to the ED for concern of abscess.  Differential diagnosis includes abdominal wall abscess, cellulitis, Bartholin gland abscess, hypertension   Problem List / ED Course:  Patient presents to the emergency department today with concerns of an abscess/cellulitis.  Reports has been ongoing for the last several days and has history of similar areas with abscess remission.  Has required incision and drainage in the past.  No history of hidradenitis suppurativa.  No reported fever, chills or bodyaches in the last several days.  She reports that the area on her lower abdomen began to drain yesterday but the areas are mostly painful, red, and swollen. On exam, patient does appear to have a likely small abscess that has been draining on the lower abdominal wall with evidence cellulitis spreading overlying the area.  There is also evidence of cellulitis in the groin with bilateral upper thighs with multiple small nodules but no appreciable fluctuance to suggest abscess. Will start patient on a course of Keflex  and Bactrim  for both strep and staph coverage given purulent drainage present. Patient did also ask if she could have her blood pressure medication refilled.  She currently takes hydrochlorothiazide 12.5 mg once daily but is not out of this medication and is not currently set up to follow-up with her PCP.  Patient's HCTZ was refilled.  I advised patient to return the emergency department for any concerns of new or worsening symptoms.  She is otherwise stable at this time for outpatient follow-up and discharged home.  Final Clinical Impression(s) / ED Diagnoses Final diagnoses:  Cellulitis of abdominal wall  Cellulitis of groin  Hypertension, unspecified type  Abscess of abdominal wall    Rx / DC Orders ED Discharge Orders          Ordered    cephALEXin  (KEFLEX ) 500 MG capsule  4 times daily        03/02/24 1531     sulfamethoxazole -trimethoprim  (BACTRIM  DS) 800-160 MG tablet  2 times daily        03/02/24 1531    hydrochlorothiazide (HYDRODIURIL) 12.5 MG tablet  Daily        03/02/24 1532              Edmond Ginsberg A, PA-C 03/02/24 1535    Mordecai Applebaum, MD 03/02/24 1712

## 2024-03-02 NOTE — Discharge Instructions (Addendum)
 You were seen in the emergency department today for concerns of possible abscess and cellulitis.  You do appear to have evidence of cellulitis on your lower abdomen as well as in your thighs.  Since one of the areas is already draining, I do not feel that you require any incision and drainage today however have started on 2 different antibiotics to help treat the infection that is currently present.  I have also sent a refill of your blood pressure medication that you should take once daily.  For any concerns of new or worsening symptoms, return to the emergency department immediately.

## 2024-03-02 NOTE — ED Triage Notes (Signed)
 Pt c/ multiple abscesses; one is above c-section scar and some are on her thighs; BP noted to be elevated, pt has not had HTN meds for a month
